# Patient Record
Sex: Male | Born: 1950
Health system: Southern US, Community
[De-identification: ages and names within clinical notes are randomized; demographics above are authoritative.]

## PROBLEM LIST (undated history)

## (undated) DIAGNOSIS — M1990 Unspecified osteoarthritis, unspecified site: Secondary | ICD-10-CM

## (undated) HISTORY — PX: KNEE ARTHROSCOPY W/ DEBRIDEMENT: SHX1867

## (undated) HISTORY — PX: ELBOW BURSA SURGERY: SHX615

## (undated) HISTORY — PX: CHOLECYSTECTOMY: SHX55

## (undated) HISTORY — PX: HYDROCELE EXCISION: SHX482

## (undated) HISTORY — PX: COLONOSCOPY: SHX174

## (undated) HISTORY — PX: COLON SURGERY: SHX602

---

## 2004-01-02 ENCOUNTER — Emergency Department (HOSPITAL_COMMUNITY): Admission: EM | Admit: 2004-01-02 | Discharge: 2004-01-02 | Payer: Self-pay | Admitting: Emergency Medicine

## 2004-01-12 ENCOUNTER — Observation Stay (HOSPITAL_COMMUNITY): Admission: AD | Admit: 2004-01-12 | Discharge: 2004-01-13 | Payer: Self-pay | Admitting: Internal Medicine

## 2006-01-20 ENCOUNTER — Ambulatory Visit: Payer: Self-pay | Admitting: Orthopedic Surgery

## 2006-01-24 ENCOUNTER — Ambulatory Visit: Payer: Self-pay | Admitting: Orthopedic Surgery

## 2006-01-24 ENCOUNTER — Ambulatory Visit (HOSPITAL_COMMUNITY): Admission: RE | Admit: 2006-01-24 | Discharge: 2006-01-24 | Payer: Self-pay | Admitting: Orthopedic Surgery

## 2006-01-27 ENCOUNTER — Ambulatory Visit: Payer: Self-pay | Admitting: Orthopedic Surgery

## 2006-01-30 ENCOUNTER — Ambulatory Visit (HOSPITAL_COMMUNITY): Admission: RE | Admit: 2006-01-30 | Discharge: 2006-01-30 | Payer: Self-pay | Admitting: Orthopedic Surgery

## 2006-01-30 ENCOUNTER — Ambulatory Visit: Payer: Self-pay | Admitting: Orthopedic Surgery

## 2006-01-31 ENCOUNTER — Ambulatory Visit: Payer: Self-pay | Admitting: Cardiology

## 2006-02-03 ENCOUNTER — Ambulatory Visit: Payer: Self-pay | Admitting: Cardiology

## 2006-02-06 ENCOUNTER — Ambulatory Visit: Payer: Self-pay | Admitting: Orthopedic Surgery

## 2006-02-06 ENCOUNTER — Ambulatory Visit: Payer: Self-pay | Admitting: Cardiology

## 2006-02-14 ENCOUNTER — Ambulatory Visit: Payer: Self-pay | Admitting: Cardiology

## 2006-02-19 ENCOUNTER — Ambulatory Visit: Payer: Self-pay | Admitting: Cardiology

## 2006-02-26 ENCOUNTER — Ambulatory Visit: Payer: Self-pay | Admitting: Cardiology

## 2006-02-27 ENCOUNTER — Ambulatory Visit: Payer: Self-pay | Admitting: Orthopedic Surgery

## 2006-03-10 ENCOUNTER — Ambulatory Visit: Payer: Self-pay | Admitting: Cardiology

## 2006-03-27 ENCOUNTER — Ambulatory Visit: Payer: Self-pay | Admitting: Orthopedic Surgery

## 2006-04-14 ENCOUNTER — Ambulatory Visit: Payer: Self-pay | Admitting: Cardiology

## 2006-04-24 ENCOUNTER — Ambulatory Visit: Payer: Self-pay | Admitting: Orthopedic Surgery

## 2006-04-29 ENCOUNTER — Ambulatory Visit: Payer: Self-pay | Admitting: Cardiology

## 2006-05-15 ENCOUNTER — Ambulatory Visit: Payer: Self-pay | Admitting: Cardiology

## 2006-06-17 ENCOUNTER — Ambulatory Visit: Payer: Self-pay | Admitting: Cardiology

## 2006-06-30 ENCOUNTER — Ambulatory Visit: Payer: Self-pay | Admitting: Orthopedic Surgery

## 2006-07-23 ENCOUNTER — Ambulatory Visit: Payer: Self-pay | Admitting: Cardiology

## 2006-09-29 ENCOUNTER — Ambulatory Visit: Payer: Self-pay | Admitting: Orthopedic Surgery

## 2006-09-30 ENCOUNTER — Encounter: Payer: Self-pay | Admitting: Orthopedic Surgery

## 2007-09-23 ENCOUNTER — Ambulatory Visit: Payer: Self-pay | Admitting: Orthopedic Surgery

## 2007-09-23 DIAGNOSIS — M25469 Effusion, unspecified knee: Secondary | ICD-10-CM | POA: Insufficient documentation

## 2007-09-23 DIAGNOSIS — M25569 Pain in unspecified knee: Secondary | ICD-10-CM | POA: Insufficient documentation

## 2007-10-07 ENCOUNTER — Ambulatory Visit: Payer: Self-pay | Admitting: Orthopedic Surgery

## 2009-12-12 ENCOUNTER — Ambulatory Visit (HOSPITAL_COMMUNITY): Admission: RE | Admit: 2009-12-12 | Discharge: 2009-12-12 | Payer: Self-pay | Admitting: General Surgery

## 2010-09-28 NOTE — H&P (Signed)
NAME:  Harold Payne, Harold Payne NO.:  [id number]   MEDICAL RECORD NO.:  [id number]          PATIENT TYPE:  AMB   LOCATION:                                FACILITY:  [facility]   PHYSICIAN:  Vickki Hearing, M.D.DATE OF BIRTH:  November 05, 1950   DATE OF ADMISSION:  DATE OF DISCHARGE:  LH                                HISTORY & PHYSICAL   CHIEF COMPLAINT:  Left knee pain.   HISTORY:  This is a 60 year old male with complains of medial and lateral  knee pain with swelling, status post injury in mid July 7 stepped off some  steps wrong, had a twisting injury, had immediate swelling and pain.  He has  trouble climbing the stairs now and turning and twisting.  He has taken some  oral anti-inflammatories which helped until they ran out.  He has also been  in a hinged knee brace.  He has had an MRI which shows osteoarthritis in his  knee mainly in the lateral femoral condyle.  There was a small Baker's cyst.  There was torn menisci medial and lateral.   We have discussed the treatment options including nonoperative treatment,  arthroscopy, arthroscopy with reconstruction and he has opted with my  recommendation for arthroscopic treatment to treat the menisci, debride the  ACL and treat the ACL tear with bracing and exercise.   REVIEW OF SYSTEMS:  He denied weight loss, chest pain, shortness of breath,  vomiting, kidney failure, headache, dizziness, thyroid disease, depression,  eczema, seasonal allergies or lymph node disease, complained of poor vision.  Wears glasses.   ALLERGIES:  NONE INCLUDING NO ALLERGY TO PENICILLIN.   No medical problems.  He has had surgery for colon cancer, left elbow.  I  did a right knee arthroscopy on him several years ago for arthritis.  He had  tonsillectomy and cholecystectomy.   MEDICATIONS:  Aciphex and lipitor.   He has a family history of heart disease, hypertension.  Mother has had a  pacemaker placed his father had kidney stones.   SOCIAL  HISTORY:  Married.  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  He is referred to [place] by  Dr.  Orvan Falconer, his medical physician.  He denies smoking.  He reports small amount  of alcohol use, coffee and sodas for caffeine use and he has his education  through grade 12.   PHYSICAL EXAMINATION:  His weight is 214.  His pulse was 74 and regular,  respiratory rate is 18 and regular.   His appearance was normal development, nutrition, grooming, hygiene, body  habitus was mesomorphic.   HEAD, EYES, EARS, NOSE AND THROAT:  Benign, neck was supple.  CHEST: Clear.  HEART:  Rate and rhythm normal.  ABDOMEN:  Was soft.  Pulses were good.  No lymphadenopathy.  Gait and station revealed a mild limp favoring his left lower extremity.   He had medial and lateral joint pain.  He did have full range of motion.  There was a small to medium-sized effusion.  His  ACL felt to have poor  endpoint, had a positive 2+ Lachman, there was no collateral ligament  instability.  Muscle strength and tone was normal.  Skin was intact.  He had  good reflexes, sensation.  He is oriented x3.  His mood and affect were  normal.  His MRI was done at [facility].  Report is included and it  showed he had an ACL tear at the femoral attachment.  A large tear of the  posterior horn of medial meniscus, small tear of anterior horn lateral  meniscus, small Baker's cyst.  There was no bone contusion or fracture in  the tibia or fibula.  There is chondromalacia and the lateral patella facet  grade 2.   IMPRESSION:  ACL tear and medial lateral meniscal tear.   PLAN:  Arthroscopy left knee for lateral and medial meniscectomies, exam of  knee under anesthesia to assess ACL instability, debride as needed but  conservative debridement of the ACL.  The patient will have therapy on the  left knee after surgery and wear a brace if needed for instability.      Vickki Hearing, M.D.  Electronically Signed     SEH/MEDQ  D:  01/20/2006  T:  01/20/2006  Job:  [id number]   cc:   Marcelino Duster  Fax: [phone]

## 2010-09-28 NOTE — Cardiovascular Report (Signed)
NAME:  Harold Payne, Harold Payne                          ACCOUNT NO.:  [id number]   MEDICAL RECORD NO.:  [id number]                   PATIENT TYPE:  INP   LOCATION:  3710                                 FACILITY:  [facility]   PHYSICIAN:  Arturo Morton. Riley Kill, M.D. [facility]         DATE OF BIRTH:  08-13-1950   DATE OF PROCEDURE:  01/12/2004  DATE OF DISCHARGE:  01/13/2004                              CARDIAC CATHETERIZATION   INDICATIONS:  The patient presents with ongoing chest pain.  He went to the  [facility] Emergency Room.  D-dimer was normal.  He was given morphine  sulfate, heparin and nitroglycerin and transferred to [facility].  The pain  was not pleuritic or positional and there were no definite EKG changes.  The  patient takes Aciphex and Lipitor.  He drinks occasional alcohol, but does  not smoke.  recommended.   PROCEDURES:  1.  Left heart catheterization.  2.  Selective coronary arteriography.  3.  Selective left ventriculography.  4.  Aortic root aortography.   DESCRIPTION OF PROCEDURE:  The patient was brought to the catheterization  lab and prepped and draped in the usual fashion.  Through an anterior  puncture, the right femoral artery was easily entered.  Central aortic and  left ventricular pressures were measured with a pigtail.  Ventriculography  was performed in the RAO projection.  Overall systolic function was  preserved.  Following pressure pullback, aortic root aortography was  performed without complication.  There was no evidence of significant aortic  regurgitation or dissection.  Following this, coronary arteriography was  performed in standard manner.  There were no complications.  The patient was  taken to the holding area in satisfactory clinical condition.   HEMODYNAMIC DATA:  1.  Central aorta:  107/75.  2.  Left ventricle:  104/11.  3.  No gradient on pullback across the aortic valve.   ANGIOGRAPHIC DATA:  1.  Ventriculography was performed in the RAO projection.   Overall systolic      function was well preserved and no segmental abnormalities or      contraction were identified.  2.  Aortic root aortography revealed no evidence of aortic regurgitation or      dissection.  3.  The right coronary artery was free of critical disease.  It is a large      caliber vessel providing a posterior descending and posterior lateral      system.  No significant focal obstruction was noted.  4.  Left main was free of critical disease.  5.  Left anterior descending artery coursed to the apex.  There is a major      diagonal branch.  The LAD and diagonal appear to be smooth without      significant narrowing.  6.  Ramus intermedius:  The superior branch of the ramus intermedius has      some mild irregularity in the mid portion of this  vessel.  On the      original shots we questioned whether there might be some delayed filling      of this vessel, but in subsequent shots the vessel looked smooth in      other views.  The inferior branch also is widely patent.  7.  The circumflex provides a marginal which has minimal luminal      irregularity.   CONCLUSION:  1.  Preserved left ventricular function.  2.  No evidence of aortic dissection.  3.  Minor coronary irregularities without significant focal areas of      obstruction.  4.  Negative D-dimer at [facility].   Aortic dissection has been ruled out as has significant high grade critical  obstruction.  Likewise, the D-dimer is normal.  Continued look for different  sources would be recommended.  The patient will be kept in the unit  overnight.  Continuous enzymes will be obtained.                                               Arturo Morton. Riley Kill, M.D. [facility]    TDS/MEDQ  D:  01/12/2004  T:  01/14/2004  Job:  [id number]   cc:   Marcelino Duster  [address] [place]  [place] [address]  Fax: [phone]

## 2010-09-28 NOTE — H&P (Signed)
NAME:  BEKIM, WERNTZ NO.:  [id number]   MEDICAL RECORD NO.:  [id number]                   PATIENT TYPE:  INP   LOCATION:  3710                                 FACILITY:  [facility]   PHYSICIAN:  Pricilla Riffle, M.D.                 DATE OF BIRTH:  05/12/1951   DATE OF ADMISSION:  01/12/2004  DATE OF DISCHARGE:                                HISTORY & PHYSICAL   IDENTIFICATION:  Harold Payne is a 60 year old gentleman with no known coronary  artery disease who presents for evaluation of chest pain.   The patient woke up this morning with chest tightness, some shortness of  breath, not like his reflux, it would not go away.  He went to his primary  MD and told him of the problem and he was sent on the Emergency Room at  [facility].  At [facility] an EKG was done that showed no acute ST changes.  He was given IV heparin, nitro and morphine with some easing of the pain  down to a 1/10 in intensity.  He was transferred to [facility] in a  [facility] transfer, he noted the pain increased to a 4/10, it is  nonpleuritic, not positional, and the patient denies injury.   ALLERGIES:  NONE.   MEDICATIONS:  1.  Aciphex.  2.  Lipitor.   PAST MEDICAL HISTORY:  1.  Dyslipidemia.  2.  GE reflux.   SOCIAL HISTORY:  The patient drinks occasionally.  No tobacco.   FAMILY HISTORY:  Mother with hypertension, father with diabetes, maternal  grandmother with CAD in 5's, maternal uncle with CAD in 53's.   PHYSICAL EXAMINATION:  This patient complains of 3/10 chest pain, but does  not appear in any acute distress.  Blood pressure 126 systolic, pulse is 60,  temperature is 98.  JVP is normal, no bruits.  LUNGS:  Clear.  CARDIAC:  Regular rate and rhythm, S1, S2, no S3, S4 or murmurs noted.  CHEST:  Nontender to palpation.  ABDOMEN:  Supple, no masses, no bruits.  EXTREMITIES:  Good distal pulses.  No lower extremity edema.  No bruits.   LABS:  Significant for a BUN  13, creatinine 1, potassium 3.8, CPK 100,  troponin less than 0.01, D-Dimer normal at 209, INR 1, hemoglobin 16.7, WBC  4.7.   Chest x-ray reported normal.   EKG shows normal sinus rhythm, no acute ST changes.   IMPRESSION:  The patient is a 60 year old with no known coronary artery  disease, presents with chest pain, this is different from his reflux,  concerning for unstable angina.  Would recommend left heart catheterization  to define anatomy.  The risks and benefits were explained.  The patient  understands and agrees to proceed.  Pricilla Riffle, M.D.    PVR/MEDQ  D:  01/12/2004  T:  01/12/2004  Job:  [id number]

## 2010-09-28 NOTE — Letter (Signed)
January 31, 2006     Vickki Hearing, M.D.  [address]  [place], [place] [address]   RE:  DEAVEN, URWIN  MRN:  [id number]  /  DOB:  [birth year]   Dear Weyman Croon:   Mr. Tubby was evaluated in the office at your request for DVT.  As you know,  this nice gentleman recently underwent arthroscopic surgery of his left  knee.  Three days postoperatively he noticed increased swelling and  discomfort in the left calf.  An ultrasound study revealed the presence of a  popliteal cyst, which has been present for some time, as well as DVT of the  veins below the knee.  Treatment with low molecular weight heparin has been  instituted, as well as warfarin.  The swelling has only increased.  The  patient continues to experience significant discomfort.   PAST MEDICAL HISTORY:  Notable for multiple other surgical procedures.  He  required drainage of a bursitis of his olecranon bursa.  Prior right knee  arthroscopy.  Cholecystectomy.  Colon resection for diverticular disease and  a polyp.  He has no hypertension or diabetes.  He underwent cardiac  catheterization 2 years ago, which was entirely normal.   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   FAMILY HISTORY:  Positive for hypertension.  His mother has required  implantation of a cardiac pacemaker.   REVIEW OF SYSTEMS:  Notable for a history of GERD and peptic ulcer disease,  and intermittent arthralgias.   EXAM:  Pleasant gentleman in no acute distress.  Weight is 213, blood pressure 125/75, heart rate 70 and regular.  NECK:  No jugular venous distension; normal carotid upstrokes without  bruits.  LUNGS:  Clear.  CARDIAC:  Normal first and second heart sounds; prominent 4th heart sounds,  normal PMI.  ABDOMEN:  Soft and nontender; no organomegaly; normal bowel sounds.  EXTREMITIES:  Moderate swelling of the left calf; healing  incisions over the  left knee; distal pulses intact.   EKG, normal sinus rhythm; within normal limits.   IMPRESSION:  Mr. Rudy has developed postoperative thrombophlebitis.  Although unusual after a minor procedure such as this one, I believe it is  warranted to consider this a non-spontaneous event.  Accordingly, a limited  duration of anticoagulation can be utilized.  I would anticipate 6 months,  but will see this nice gentleman again in 3 months to consider exactly when  warfarin can be discontinued.  He is advised to keep his leg elevated as  much as possible.  We will give him compression stockings for the left leg  only.  He is advised not to sit for prolonged periods of time with his legs  dependent.  We will monitor warfarin therapy in anticoagulation clinic.  Thanks so much for sending this nice gentleman to [place].    Sincerely,      Gerrit Friends. Dietrich Pates, MD, [facility]   RMR/MedQ  DD:  01/31/2006  DT:  02/03/2006  Job #:  [id number]

## 2010-09-28 NOTE — Discharge Summary (Signed)
NAME:  Harold Payne, Harold Payne NO.:  [id number]   MEDICAL RECORD NO.:  [id number]                   PATIENT TYPE:  INP   LOCATION:  3710                                 FACILITY:  [facility]   PHYSICIAN:  Pricilla Riffle, M.D.                 DATE OF BIRTH:  21-Jul-1950   DATE OF ADMISSION:  01/12/2004  DATE OF DISCHARGE:  01/13/2004                           DISCHARGE SUMMARY - REFERRING   PROCEDURE:  Cardiac catheterization September 1.   REASON FOR ADMISSION:  Patient is a 60 year old male with no prior history  of coronary artery disease transferred from [facility] Emergency Room  for evaluation of chest pain.  Please refer to dictated admission note for  full details.   LABORATORY DATA:  Normal cardiac markers.   HOSPITAL COURSE:  Following initial presentation to [facility]  Emergency Room where patient was stabilized on intravenous nitroglycerin and  heparin and where initial serial cardiac markers as well as D-dimer were  normal, patient was transferred and arrangements were made for same day  coronary angiography.   Procedure was performed by Dr. Riley Kill (see report for full details)  revealing normal epicardial coronary arteries and only minor irregularities  of some circumflex branches.  The aortic arch was normal as was the left  ventricle.   Dr. Riley Kill recommended medical therapy and consideration for GI evaluation.   Patient was kept for overnight observation and cleared for discharged the  following morning in hemodynamically stable condition.  Follow-up serial  cardiac markers were all normal.  No complications of right groin were  noted.   DISCHARGE MEDICATIONS:  1.  Coated aspirin 81 mg daily.  2.  Aciphex as previously directed.  3.  Lipitor 5 mg daily.   DISCHARGE INSTRUCTIONS:  No heavy lifting/driving x2 days.   DIET:  Maintain low fat, low cholesterol diet.   FOLLOWUP:  Call the office if there is any  swelling/bleeding of the groin.   Patient is scheduled to follow up with Arnette Felts, P.A.-C. on Friday,  September 9 at 11:15 a.m. at the [facility] in [place].  He was also  instructed to follow up with Dr. Marcelino Duster.   DISCHARGE DIAGNOSES:  1.  Noncardiac chest pain.      1.  Normal coronary angiogram.      2.  Normal left ventricular function.  2.  Dyslipidemia.  3.  Gastroesophageal reflux disease.      Gene Serpe, P.A. [facility]                      Pricilla Riffle, M.D.    GS/MEDQ  D:  01/13/2004  T:  01/14/2004  Job:  [id number]   cc:   Marcelino Duster  [address] [place]  [place] [address]  Fax: [phone]

## 2010-09-28 NOTE — Assessment & Plan Note (Signed)
[facility]                       [place] OFFICE NOTE   Harold Payne, Harold Payne                       MRN:          [id number]  DATE:07/23/2006                            DOB:          March 17, 1951    REFERRED BY:  None.   Harold Payne returns to the office for continued assessment and treatment  of DVT. It has now been six months since he developed this problem  following arthroscopic knee surgery. He was recently seen by Dr.  [name], who discontinued his warfarin. The patient some continued  issues with knee function, but he has noted no leg swelling and no calf  pain. He has stopped wearing his compression stockings. He reports a  fairly high sodium chloride intake.   His current medications include only atorvastatin 5 mg daily and Aciphex  20 mg daily.   On examination, pleasant, healthy-appearing gentleman. The weight is  225, two pounds more than in December. Blood pressure 120/80. Heart rate  is 65 and regular. Respirations is 16.  EXTREMITIES: No calf tenderness; 1/2+ pre-tibial edema.   IMPRESSION:  Harold Payne does have some minimal evidence of venous  insufficiency, but this is bilateral. Since his episode of DVT occurred  in the setting of a precipitating event, a six month course is  reasonable. The issues regarding salt intake were explained to him and  the options of leg elevation, reduction of sodium chloride in his diet  and compression stockings were described. He will return if he  experiences recurrent symptoms in the leg that could represent DVT.  Otherwise, I will be available to assist with his care as needed.     Gerrit Friends. Dietrich Pates, MD, [facility]  Electronically Signed    RMR/MedQ  DD: 07/23/2006  DT: 07/24/2006  Job #: [id number]   cc:   Vickki Hearing, M.D.

## 2010-09-28 NOTE — Op Note (Signed)
NAME:  Harold Payne, Harold Payne NO.:  [id number]   MEDICAL RECORD NO.:  [id number]          PATIENT TYPE:  AMB   LOCATION:  DAY                           FACILITY:  [facility]   PHYSICIAN:  Vickki Hearing, M.D.DATE OF BIRTH:  07-Feb-1951   DATE OF PROCEDURE:  01/24/2006  DATE OF DISCHARGE:                                 OPERATIVE REPORT   MEDICAL HISTORY:  Harold Payne is 60 years old and complained of medial and  lateral knee pain with swelling.  He injured himself July 7 stepping off  some steps, twisted his knee, had immediate swelling and pain.  He had  trouble climbing stairs and turning and twisting and pivoting on the left  knee. He initially took some oral anti-inflammatories and wore a hinged knee  brace and did not improve.  He subsequently had an MRI which showed  osteoarthritis, mainly of the lateral femoral condyle.  There was a torn  medial meniscus and a torn lateral meniscus, and it appeared that he had a  torn ACL.  Therefore, he was taken to surgery.   PREOPERATIVE DIAGNOSIS:  Torn anterior cruciate ligament, medial and lateral  meniscus, with osteoarthritis of the left knee.   POSTOPERATIVE DIAGNOSIS:  Torn medial meniscus, left knee, osteoarthritis  functional nonsurgical tear anterior cruciate ligament.   PROCEDURE:  Exam under anesthesia, arthroscopy, left knee, meniscectomy  medial meniscus, and abrasion arthroplasty lateral femoral condyle.   OPERATIVE FINDINGS:  The most notable finding was a torn medial meniscus,  complex tear posterior horn, quite extensive.  There was a grade 3 lesion of  the medial femoral condyle. There was a grade 4 lesion of the lateral  femoral condyle.  The ACL had scarred down to the PCL and was functionally  intact with no significant instability under anesthesia.   SURGEON:  Vickki Hearing, M.D.   ASSISTANT:  None.   ANESTHESIA:  General.   SPECIMENS:  None.   ESTIMATED BLOOD LOSS:  Minimal.   COMPLICATIONS:  None.   COUNTS:  Correct.   DISPOSITION:  The patient went to PACU in good condition.   PROCEDURE:  Harold Payne was identified in the preop holding area, his left  knee was marked for surgery.  This was then countersigned by me, the  surgeon.  Ancef was started.  His history and physical was updated and he  was taken to the operating room for general anesthetic.  After successful  anesthesia, he had his left knee prepped and draped and time-out procedure  was completed. We confirmed the procedure as arthroscopy of the left knee on  Harold Payne.   After a brief exam under anesthesia revealed no significant instability, a  lateral portal was established. A diagnostic arthroscopy was performed and  then a medial portal was established.  Using a duckbill motorized shaver and  a 50 degrees ArthroCare wand, a meniscectomy was performed until a stable  rim remained. The lesion on the medial femoral condyle was diffuse and the  cartilage was deficient, but there was no large gap defect or crater. This  was left alone.   We then turned our attention to the ACL which had been scarred down to the  PCL and the distal fibers was intact.   The lateral compartment presented a dilemma.  The patient had a large  fissure with delamination of a large portion of his lateral femoral  cartilage.  This was debrided to a stable rim which left a large grade 4  lesion.  We then performed an abrasion arthroplasty using a motorized bur  until we got of bleeding bone bed.  The knee was irrigated, suctioned dry.  The remaining fragments of meniscal tissue and articular cartilage tissue,  which was floating, was removed and the knee was injected with the remaining  10 mL of Marcaine with epinephrine.  Steri-Strips were used to close the  wound.   The patient will be discharged home in a brace.  He is to ambulate only with  the brace and crutches.  He will have a Cryocuff, Lorcet Plus.  He  can  change his dressing on Saturday, apply two Band-Aids.  He will return to me  on Monday to discuss further treatment.   I would like to get him set up with a CPM machine, keep him weight bearing  protected for an additional six weeks and start active and passive range of  motion with strengthening exercises in his left knee.  We dispensed 90  Lorcet Plus with three refills which should last about two months.      Vickki Hearing, M.D.  Electronically Signed     SEH/MEDQ  D:  01/24/2006  T:  01/24/2006  Job:  [id number]

## 2010-09-28 NOTE — Letter (Signed)
April 29, 2006    Vickki Hearing, M.D.  [address]  [place], [place] [address]   RE:  Harold Payne, Harold Payne  MRN:  [id number]  /  DOB:  16-Dec-1950   Dear Weyman Croon:   Mr. Harold Payne returns to the office for continued assessment and treatment  of DVT that occurred immediately following left knee arthroscopic  surgery.  He wore his compression stockings until 2 weeks ago.  He has  noted mild edema, but both the leg and knee have progressively improved  since his surgery.  He has done well on anticoagulation with stable INR  and no apparent adverse effects of warfarin.   Current medications include:  1. Atorvastatin 5 mg daily.  2. AcipHex 20 mg daily.  3. Warfarin as directed.   EXAMINATION:  GENERAL:  Pleasant well-appearing gentleman in no acute  distress.  VITAL SIGNS:  The weight is 223, 10 pounds more than 3 months ago.  Blood pressure 120/75, heart rate 70 and regular, respirations 16.  LUNGS:  Clear.  CARDIAC:  Normal first and second heart sounds.  EXTREMITIES:  No edema on the left; no calf tenderness.   IMPRESSION:  Mr. Harold Payne is doing generally well.  We will complete a 6-  month course of warfarin.  Stool for hemoccult testing and a CBC will be  checked due to his chronic use of anticoagulants.  I will see this nice  gentleman again in March.    Sincerely,      Gerrit Friends. Dietrich Pates, MD, [facility]  Electronically Signed    RMR/MedQ  DD: 04/29/2006  DT: 04/29/2006  Job #: [id number]

## 2012-04-24 DIAGNOSIS — R079 Chest pain, unspecified: Secondary | ICD-10-CM

## 2013-05-28 NOTE — H&P (Signed)
  NTS SOAP Note  Vital Signs:  Vitals as of: 1/85/6314: Systolic 970: Diastolic 82: Heart Rate 65: Temp 99.76F: Height 32ft 1in: Weight 221Lbs 0 Ounces: BMI 29.16  BMI : 29.16 kg/m2  Subjective: This 63 Years 1 Months old Male presents for followup colonoscopy.  Last had a colonoscopy over three years ago.  Had colon resection and was found to have a cancerous polyp.  Denies any gi complaints except for heartburn.  Review of Symptoms:  Constitutional:unremarkable   Head:unremarkable    Eyes:unremarkable   Nose/Mouth/Throat:unremarkable Cardiovascular:  unremarkable   Respiratory:unremarkable   Gastrointestinal:  unremarkable   Genitourinary:unremarkable     Musculoskeletal:unremarkable   Skin:unremarkable Hematolgic/Lymphatic:unremarkable     Allergic/Immunologic:unremarkable     Past Medical History:    Reviewed   Past Medical History  Surgical History: colon resection, knee surgeries, cholecystectomy Medical Problems: high cholesterol, reflux Allergies: nkda Medications: aciphex, lipitor, centrum   Social History:Reviewed  Social History  Preferred Language: English Race:  White Ethnicity: Not Hispanic / Latino Age: 63 Years 6 Months Marital Status:  M Alcohol: occassionally Recreational drug(s): no   Smoking Status: Never smoker reviewed on 05/27/2013 Functional Status reviewed on 05/27/2013 ------------------------------------------------ Bathing: Normal Cooking: Normal Dressing: Normal Driving: Normal Eating: Normal Managing Meds: Normal Oral Care: Normal Shopping: Normal Toileting: Normal Transferring: Normal Walking: Normal Cognitive Status reviewed on 05/27/2013 ------------------------------------------------ Attention: Normal Decision Making: Normal Language: Normal Memory: Normal Motor: Normal Perception: Normal Problem Solving: Normal Visual and Spatial: Normal   Family History:   [place] History Mother, Living; Heart block (conduction disorder);  Father, Living; Diabetes mellitus, unspecified type;     Objective Information: General:  Well appearing, well nourished in no distress. Heart:  RRR, no murmur Lungs:    CTA bilaterally, no wheezes, rhonchi, rales.  Breathing unlabored. Abdomen:Soft, NT/ND, no HSM, no masses.  Has small reducible incisional hernia at level of umbilicus.   rectal deferred to procedure  Assessment:h/o colon cancer    Plan:  Scheduled for TCS on 06/29/13.   Patient Education:Alternative treatments to surgery were discussed with patient (and family).  Risks and benefits  of procedure including bleeding and perforation were fully explained to the patient (and family) who gave informed consent. Patient/family questions were addressed.  Follow-up:Pending Surgery

## 2013-06-17 ENCOUNTER — Encounter (HOSPITAL_COMMUNITY): Payer: Self-pay | Admitting: Pharmacy Technician

## 2013-07-20 ENCOUNTER — Encounter (HOSPITAL_COMMUNITY)
Admission: RE | Disposition: A | Discharge status: Home / Self Care | Payer: Self-pay | Source: Ambulatory Visit | Attending: General Surgery

## 2013-07-20 ENCOUNTER — Encounter (HOSPITAL_COMMUNITY): Payer: Self-pay | Admitting: *Deleted

## 2013-07-20 ENCOUNTER — Ambulatory Visit (HOSPITAL_COMMUNITY)
Admission: RE | Admit: 2013-07-20 | Discharge: 2013-07-20 | Disposition: A | Discharge status: Home / Self Care | Payer: BC Managed Care – PPO | Source: Ambulatory Visit | Attending: General Surgery | Admitting: General Surgery

## 2013-07-20 DIAGNOSIS — Z79899 Other long term (current) drug therapy: Secondary | ICD-10-CM | POA: Insufficient documentation

## 2013-07-20 DIAGNOSIS — Z85038 Personal history of other malignant neoplasm of large intestine: Secondary | ICD-10-CM | POA: Insufficient documentation

## 2013-07-20 DIAGNOSIS — Z9049 Acquired absence of other specified parts of digestive tract: Secondary | ICD-10-CM | POA: Insufficient documentation

## 2013-07-20 DIAGNOSIS — Z09 Encounter for follow-up examination after completed treatment for conditions other than malignant neoplasm: Secondary | ICD-10-CM | POA: Insufficient documentation

## 2013-07-20 DIAGNOSIS — Z980 Intestinal bypass and anastomosis status: Secondary | ICD-10-CM | POA: Insufficient documentation

## 2013-07-20 DIAGNOSIS — E7800 Pure hypercholesterolemia, unspecified: Secondary | ICD-10-CM | POA: Insufficient documentation

## 2013-07-20 HISTORY — PX: COLONOSCOPY: SHX5424

## 2013-07-20 SURGERY — COLONOSCOPY
Anesthesia: Moderate Sedation

## 2013-07-20 MED ORDER — STERILE WATER FOR IRRIGATION IR SOLN
Status: DC | PRN
  Administered 2013-07-20: 08:00:00

## 2013-07-20 MED ORDER — MEPERIDINE HCL 50 MG/ML IJ SOLN
INTRAMUSCULAR | Status: AC
  Filled 2013-07-20: qty 1

## 2013-07-20 MED ORDER — MEPERIDINE HCL 50 MG/ML IJ SOLN
INTRAMUSCULAR | Status: DC | PRN
  Administered 2013-07-20: 50 mg via INTRAVENOUS

## 2013-07-20 MED ORDER — SODIUM CHLORIDE 0.9 % IV SOLN
INTRAVENOUS | Status: DC
Start: 2013-07-20 — End: 2013-07-20
  Administered 2013-07-20: 08:00:00 via INTRAVENOUS

## 2013-07-20 MED ORDER — MIDAZOLAM HCL 5 MG/5ML IJ SOLN
INTRAMUSCULAR | Status: DC | PRN
  Administered 2013-07-20: 3 mg via INTRAVENOUS

## 2013-07-20 MED ORDER — MIDAZOLAM HCL 5 MG/5ML IJ SOLN
INTRAMUSCULAR | Status: AC
  Filled 2013-07-20: qty 10

## 2013-07-20 NOTE — Discharge Instructions (Signed)
Colonoscopy, Care After °Refer to this sheet in the next few weeks. These instructions provide you with information on caring for yourself after your procedure. Your health care provider may also give you more specific instructions. Your treatment has been planned according to current medical practices, but problems sometimes occur. Call your health care provider if you have any problems or questions after your procedure. °WHAT TO EXPECT AFTER THE PROCEDURE  °After your procedure, it is typical to have the following: °· A small amount of blood in your stool. °· Moderate amounts of gas and mild abdominal cramping or bloating. °HOME CARE INSTRUCTIONS °· Do not drive, operate machinery, or sign important documents for 24 hours. °· You may shower and resume your regular physical activities, but move at a slower pace for the first 24 hours. °· Take frequent rest periods for the first 24 hours. °· Walk around or put a warm pack on your abdomen to help reduce abdominal cramping and bloating. °· Drink enough fluids to keep your urine clear or pale yellow. °· You may resume your normal diet as instructed by your health care provider. Avoid heavy or fried foods that are hard to digest. °· Avoid drinking alcohol for 24 hours or as instructed by your health care provider. °· Only take over-the-counter or prescription medicines as directed by your health care provider. °· If a tissue sample (biopsy) was taken during your procedure: °· Do not take aspirin or blood thinners for 7 days, or as instructed by your health care provider. °· Do not drink alcohol for 7 days, or as instructed by your health care provider. °· Eat soft foods for the first 24 hours. °SEEK MEDICAL CARE IF: °You have persistent spotting of blood in your stool 2 3 days after the procedure. °SEEK IMMEDIATE MEDICAL CARE IF: °· You have more than a small spotting of blood in your stool. °· You pass large blood clots in your stool. °· Your abdomen is swollen  (distended). °· You have nausea or vomiting. °· You have a fever. °· You have increasing abdominal pain that is not relieved with medicine. °Document Released: 12/12/2003 Document Revised: 02/17/2013 Document Reviewed: 01/04/2013 °ExitCare® Patient Information ©2014 [employer]. ° °

## 2013-07-20 NOTE — Op Note (Signed)
[facility] [address] [place], [address]   COLONOSCOPY PROCEDURE REPORT  PATIENT: Harold, Payne  MR#: [id number] BIRTHDATE: 1950-11-23 , 62  yrs. old GENDER: Male ENDOSCOPIST: Aviva Signs, MD REFERRED GH:WEXH, Amy PROCEDURE DATE:  07/20/2013 PROCEDURE:   Colonoscopy, surveillance ASA CLASS:   Class II INDICATIONS:colonic adenocarcinoma. MEDICATIONS: Versed 3 mg IV and Demerol 50 mg IV  DESCRIPTION OF PROCEDURE:   After the risks benefits and alternatives of the procedure were thoroughly explained, informed consent was obtained.  A digital rectal exam revealed no abnormalities of the rectum.   The EC-[id number] ([id number])  endoscope was introduced through the anus and advanced to the cecum, which was identified by both the appendix and ileocecal valve. No adverse events experienced.   The quality of the prep was adequate, using MoviPrep  The instrument was then slowly withdrawn as the colon was fully examined.      COLON FINDINGS: A normal appearing cecum, ileocecal valve, and appendiceal orifice were identified.  The ascending, hepatic flexure, transverse, splenic flexure, descending, sigmoid colon and rectum appeared unremarkable.  No polyps or cancers were seen. Anastomosis at colorectal juncture widely patent.  Retroflexed views revealed no abnormalities. The time to cecum=2 minutes 0 seconds.  Withdrawal time=5 minutes 0 seconds.  The scope was withdrawn and the procedure completed. COMPLICATIONS: There were no complications.  ENDOSCOPIC IMPRESSION: 1.   Normal colon 2.   Anastomosis at colorectal juncture widely patent.  RECOMMENDATIONS: Repeat Colonoscopy in 5 years.   eSigned:  Aviva Signs, MD 07/20/2013 8:26 AM   cc:

## 2013-07-20 NOTE — Interval H&P Note (Signed)
History and Physical Interval Note:  07/20/2013 7:58 AM  Harold Payne  has presented today for surgery, with the diagnosis of h/o colon cancer  The various methods of treatment have been discussed with the patient and family. After consideration of risks, benefits and other options for treatment, the patient has consented to  Procedure(s): COLONOSCOPY (N/A) as a surgical intervention .  The patient's history has been reviewed, patient examined, no change in status, stable for surgery.  I have reviewed the patient's chart and labs.  Questions were answered to the patient's satisfaction.     Aviva Signs A

## 2013-07-22 ENCOUNTER — Encounter (HOSPITAL_COMMUNITY): Payer: Self-pay | Admitting: General Surgery

## 2014-02-01 ENCOUNTER — Encounter (INDEPENDENT_AMBULATORY_CARE_PROVIDER_SITE_OTHER): Payer: Self-pay

## 2014-02-01 ENCOUNTER — Ambulatory Visit (INDEPENDENT_AMBULATORY_CARE_PROVIDER_SITE_OTHER): Payer: BC Managed Care – PPO | Admitting: Orthopedic Surgery

## 2014-02-01 ENCOUNTER — Ambulatory Visit (INDEPENDENT_AMBULATORY_CARE_PROVIDER_SITE_OTHER): Payer: BC Managed Care – PPO

## 2014-02-01 VITALS — BP 141/83 | Ht 73.0 in | Wt 218.0 lb

## 2014-02-01 DIAGNOSIS — M25569 Pain in unspecified knee: Secondary | ICD-10-CM

## 2014-02-01 DIAGNOSIS — M25562 Pain in left knee: Secondary | ICD-10-CM

## 2014-02-01 DIAGNOSIS — T148XXA Other injury of unspecified body region, initial encounter: Secondary | ICD-10-CM

## 2014-02-01 MED ORDER — HYDROCODONE-ACETAMINOPHEN 7.5-325 MG PO TABS: 1.0000 | ORAL_TABLET | Freq: Four times a day (QID) | ORAL | Status: DC | PRN

## 2014-02-01 NOTE — Progress Notes (Signed)
New patient visit  Chief Complaint  Patient presents with  . Knee Pain    Left knee pain and swelling x 1 week   Knee Pain: Patient complains of left knee pain. This is evaluated as a personal injury. The pain began 1 week ago. The pain is located medial tibial plateau which started as a direct trauma to the left knee with no pre-injury symptoms despite previous knee surgery back in 2007 at which time have a torn medial meniscus and osteoarthritis and a functional nonsurgical tear the anterior cruciate ligament with exam under anesthesia showing no instability. Of note there was a grade 3 lesion medial femoral condyle and grade 4 lesion of the lateral femoral condyle  After the tree stand leg hit his knee about a week later Advil has not relieved his symptoms his pain is worse with walking  Review of systems negative he does have some back pain but everything else was normal  Medical history of blood clots and arthritis  . Right knee surgery left knee surgery colon resection cholecystectomy and surgery on the left elbow  Current medications are AcipHex Lipitor Centrum and Advil  He did not report any allergies  He has a family history diabetes COPD reflux hypertension  He does not smoke he has an occasional drink every week does not have any Street drug use  BP 141/83  Ht 6\' 1"  (1.854 m)  Wt 218 lb (98.884 kg)  BMI 28.77 kg/m2 PS well-developed and well-nourished grooming and hygiene are normal he has no deformities he has normal orientation to person place and time his mood and affect are normal and pleasant his gait and station are unremarkable and there is no limp  Has tenderness over the proximal tibia on the medial side with minimal joint line tenderness no effusion full range of motion and a stable knee the muscle tone is normal scans intact he has good distal pulses without lymphadenopathy. Sensation is normal in the left leg is no pathologic reflexes has excellent  balance  His x-rays show severe medial arthritis of the knee  Impression bone contusion  He is going on a trip like something done to try to get him through history I injected his knee for pain over the medial tibia this was not a joint injection  Procedure note  Injection  Verbal consent was obtained to inject the  left knee/tibia  Timeout procedure was completed to confirm injection site  Diagnosis bone contusion tibia  Medications used Depo-Medrol 40 mg 1 cc Lidocaine 1% plain 3 cc  Anesthesia was provided by ethyl chloride spray  Prep was performed with alcohol  Technique of injection  at the point of maximal tenderness the medication was injected no complications   No complications were noted

## 2016-09-21 DIAGNOSIS — M62830 Muscle spasm of back: Secondary | ICD-10-CM | POA: Diagnosis not present

## 2016-09-21 DIAGNOSIS — Z6830 Body mass index (BMI) 30.0-30.9, adult: Secondary | ICD-10-CM | POA: Diagnosis not present

## 2016-10-28 DIAGNOSIS — R739 Hyperglycemia, unspecified: Secondary | ICD-10-CM | POA: Diagnosis not present

## 2016-10-28 DIAGNOSIS — K210 Gastro-esophageal reflux disease with esophagitis: Secondary | ICD-10-CM | POA: Diagnosis not present

## 2016-10-28 DIAGNOSIS — E782 Mixed hyperlipidemia: Secondary | ICD-10-CM | POA: Diagnosis not present

## 2016-10-28 DIAGNOSIS — E7800 Pure hypercholesterolemia, unspecified: Secondary | ICD-10-CM | POA: Diagnosis not present

## 2016-10-28 DIAGNOSIS — G459 Transient cerebral ischemic attack, unspecified: Secondary | ICD-10-CM | POA: Diagnosis not present

## 2016-11-01 DIAGNOSIS — Z6830 Body mass index (BMI) 30.0-30.9, adult: Secondary | ICD-10-CM | POA: Diagnosis not present

## 2016-11-01 DIAGNOSIS — E782 Mixed hyperlipidemia: Secondary | ICD-10-CM | POA: Diagnosis not present

## 2016-11-01 DIAGNOSIS — L03311 Cellulitis of abdominal wall: Secondary | ICD-10-CM | POA: Diagnosis not present

## 2016-11-01 DIAGNOSIS — R7301 Impaired fasting glucose: Secondary | ICD-10-CM | POA: Diagnosis not present

## 2016-11-01 DIAGNOSIS — Z1389 Encounter for screening for other disorder: Secondary | ICD-10-CM | POA: Diagnosis not present

## 2016-11-01 DIAGNOSIS — K219 Gastro-esophageal reflux disease without esophagitis: Secondary | ICD-10-CM | POA: Diagnosis not present

## 2016-11-18 DIAGNOSIS — R509 Fever, unspecified: Secondary | ICD-10-CM | POA: Diagnosis not present

## 2016-11-18 DIAGNOSIS — J209 Acute bronchitis, unspecified: Secondary | ICD-10-CM | POA: Diagnosis not present

## 2016-11-18 DIAGNOSIS — Z6830 Body mass index (BMI) 30.0-30.9, adult: Secondary | ICD-10-CM | POA: Diagnosis not present

## 2016-11-21 DIAGNOSIS — J209 Acute bronchitis, unspecified: Secondary | ICD-10-CM | POA: Diagnosis not present

## 2016-11-21 DIAGNOSIS — Z6830 Body mass index (BMI) 30.0-30.9, adult: Secondary | ICD-10-CM | POA: Diagnosis not present

## 2016-11-21 DIAGNOSIS — J0101 Acute recurrent maxillary sinusitis: Secondary | ICD-10-CM | POA: Diagnosis not present

## 2016-12-03 ENCOUNTER — Encounter: Payer: Self-pay | Admitting: General Surgery

## 2016-12-03 ENCOUNTER — Ambulatory Visit (INDEPENDENT_AMBULATORY_CARE_PROVIDER_SITE_OTHER): Payer: Medicare Other | Admitting: General Surgery

## 2016-12-03 VITALS — BP 145/89 | HR 62 | Temp 98.7°F | Resp 18 | Ht 73.0 in | Wt 225.0 lb

## 2016-12-03 DIAGNOSIS — K432 Incisional hernia without obstruction or gangrene: Secondary | ICD-10-CM | POA: Diagnosis not present

## 2016-12-03 NOTE — Progress Notes (Signed)
Harold Payne, Harold Payne; [id number]; [birth year]   HPI   Patient is a 66 year old white male who was referred to my care by Dr. Rory Percy for evaluation and treatment of a ventral hernia.  Patient states he has been present for several years, but recently noticed increase in size of the hernia.  It is not uncomfortable at this time.  He is status post a partial colectomy in the past and another facility.  He denies any nausea or vomiting.  He has 0/10 pain. No past medical history on file.  Past Surgical History:  Procedure Laterality Date  . COLON SURGERY    . COLONOSCOPY    . COLONOSCOPY N/A 07/20/2013   Procedure: COLONOSCOPY;  Surgeon: Jamesetta So, MD;  Location: [facility];  Service: Gastroenterology;  Laterality: N/A;  . ELBOW BURSA SURGERY Left   . HYDROCELE EXCISION    . KNEE ARTHROSCOPY W/ DEBRIDEMENT      No family history on file.  Current Outpatient Prescriptions on File Prior to Visit  Medication Sig Dispense Refill  . atorvastatin (LIPITOR) 10 MG tablet Take 5 mg by mouth daily.    Marland Kitchen HYDROcodone-acetaminophen (NORCO) 7.5-325 MG per tablet Take 1 tablet by mouth every 6 (six) hours as needed for moderate pain. 60 tablet 0  . ibuprofen (ADVIL,MOTRIN) 200 MG tablet Take 800 mg by mouth 2 (two) times daily as needed for moderate pain.    . Multiple Vitamins-Minerals (CENTRUM PO) Take 1 tablet by mouth daily.    . RABEprazole (ACIPHEX) 20 MG tablet Take 20 mg by mouth daily.     No current facility-administered medications on file prior to visit.     No Known Allergies  History  Alcohol Use  . Yes    Comment: once a week    History  Smoking Status  . Never Smoker  Smokeless Tobacco  . Never Used    Review of Systems  Constitutional: Negative.   HENT: Negative.   Eyes: Negative.   Respiratory: Negative.   Cardiovascular: Negative.   Gastrointestinal: Negative.   Genitourinary: Negative.   Musculoskeletal: Negative.   Skin: Negative.   Neurological:  Negative.   Endo/Heme/Allergies: Negative.   Psychiatric/Behavioral: Negative.     Objective   Vitals:   12/03/16 1131  BP: (!) 145/89  Pulse: 62  Resp: 18  Temp: 98.7 F (37.1 C)    Physical Exam  Constitutional: He is oriented to person, place, and time and well-developed, well-nourished, and in no distress.  HENT:  Head: Normocephalic and atraumatic.  Cardiovascular: Normal rate, regular rhythm and normal heart sounds.   Pulmonary/Chest: Effort normal and breath sounds normal. He has no wheezes.  Abdominal: Soft. Bowel sounds are normal. He exhibits no distension. There is no tenderness.  Reducible incisional hernia just to the right of the midline incision below the umbilicus.  Nontender.  Neurological: He is alert and oriented to person, place, and time.  Skin: Skin is warm and dry.  Vitals reviewed.    Dr. Lyman Speller notes reviewed. Assessment    Incisional hernia, currently asymptomatic Plan    no need for acute surgical intervention at this time as patient is asymptomatic and the risk of incarceration is low.  Patient understands this and agrees.  He was given a signs and symptoms of incarceration.  Literature was given.  Follow-up as needed.

## 2016-12-03 NOTE — Patient Instructions (Signed)
 Ventral Hernia A ventral hernia is a bulge of tissue from inside the abdomen that pushes through a weak area of the muscles that form the front wall of the abdomen. The tissues inside the abdomen are inside a sac (peritoneum). These tissues include the small intestine, large intestine, and the fatty tissue that covers the intestines (omentum). Sometimes, the bulge that forms a hernia contains intestines. Other hernias contain only fat. Ventral hernias do not go away without surgical treatment. There are several types of ventral hernias. You may have:  A hernia at an incision site from previous abdominal surgery (incisional hernia).  A hernia just above the belly button (epigastric hernia), or at the belly button (umbilical hernia). These types of hernias can develop from heavy lifting or straining.  A hernia that comes and goes (reducible hernia). It may be visible only when you lift or strain. This type of hernia can be pushed back into the abdomen (reduced).  A hernia that traps abdominal tissue inside the hernia (incarcerated hernia). This type of hernia does not reduce.  A hernia that cuts off blood flow to the tissues inside the hernia (strangulated hernia). The tissues can start to die if this happens. This is a very painful bulge that cannot be reduced. A strangulated hernia is a medical emergency.  What are the causes? This condition is caused by abdominal tissue putting pressure on an area of weakness in the abdominal muscles. What increases the risk? The following factors may make you more likely to develop this condition:  Being male.  Being 60 or older.  Being overweight or obese.  Having had previous abdominal surgery, especially if there was an infection after surgery.  Having had an injury to the abdominal wall.  Having had several pregnancies.  Having a buildup of fluid inside the abdomen (ascites).  What are the signs or symptoms? The only symptom of a ventral  hernia may be a painless bulge in the abdomen. A reducible hernia may be visible only when you strain, cough, or lift. Other symptoms may include:  Dull pain.  A feeling of pressure.  Signs and symptoms of a strangulated hernia may include:  Increasing pain.  Nausea and vomiting.  Pain when pressing on the hernia.  The skin over the hernia turning red or purple.  Constipation.  Blood in the stool (feces).  How is this diagnosed? This condition may be diagnosed based on:  Your symptoms.  Your medical history.  A physical exam. You may be asked to cough or strain while standing. These actions increase the pressure inside your abdomen and force the hernia through the opening in your muscles. Your health care provider may try to reduce the hernia by pressing on it.  Imaging studies, such as an ultrasound or CT scan.  How is this treated? This condition is treated with surgery. If you have a strangulated hernia, surgery is done as soon as possible. If your hernia is small and not incarcerated, you may be asked to lose some weight before surgery. Follow these instructions at home:  Follow instructions from your health care provider about eating or drinking restrictions.  If you are overweight, your health care provider may recommend that you increase your activity level and eat a healthier diet.  Do not lift anything that is heavier than 10 lb (4.5 kg).  Return to your normal activities as told by your health care provider. Ask your health care provider what activities are safe for you. You   may need to avoid activities that increase pressure on your hernia.  Take over-the-counter and prescription medicines only as told by your health care provider.  Keep all follow-up visits as told by your health care provider. This is important. Contact a health care provider if:  Your hernia gets larger.  Your hernia becomes painful. Get help right away if:  Your hernia becomes  increasingly painful.  You have pain along with any of the following: ? Changes in skin color in the area of the hernia. ? Nausea. ? Vomiting. ? Fever. Summary  A ventral hernia is a bulge of tissue from inside the abdomen that pushes through a weak area of the muscles that form the front wall of the abdomen.  This condition is treated with surgery, which may be urgent depending on your hernia.  Do not lift anything that is heavier than 10 lb (4.5 kg), and follow activity instructions from your health care provider. This information is not intended to replace advice given to you by your health care provider. Make sure you discuss any questions you have with your health care provider. Document Released: 04/15/2012 Document Revised: 12/15/2015 Document Reviewed: 12/15/2015 Elsevier Interactive Patient Education  2018 [employer]  

## 2017-02-11 DIAGNOSIS — Z23 Encounter for immunization: Secondary | ICD-10-CM | POA: Diagnosis not present

## 2017-02-11 DIAGNOSIS — Z6830 Body mass index (BMI) 30.0-30.9, adult: Secondary | ICD-10-CM | POA: Diagnosis not present

## 2017-02-11 DIAGNOSIS — M25562 Pain in left knee: Secondary | ICD-10-CM | POA: Diagnosis not present

## 2017-02-11 DIAGNOSIS — M1990 Unspecified osteoarthritis, unspecified site: Secondary | ICD-10-CM | POA: Diagnosis not present

## 2017-03-11 DIAGNOSIS — M25562 Pain in left knee: Secondary | ICD-10-CM | POA: Diagnosis not present

## 2017-03-11 DIAGNOSIS — M170 Bilateral primary osteoarthritis of knee: Secondary | ICD-10-CM | POA: Diagnosis not present

## 2017-03-11 DIAGNOSIS — M25561 Pain in right knee: Secondary | ICD-10-CM | POA: Diagnosis not present

## 2017-03-30 DIAGNOSIS — M1990 Unspecified osteoarthritis, unspecified site: Secondary | ICD-10-CM | POA: Diagnosis not present

## 2017-03-30 DIAGNOSIS — T7840XA Allergy, unspecified, initial encounter: Secondary | ICD-10-CM | POA: Diagnosis not present

## 2017-03-30 DIAGNOSIS — Z87891 Personal history of nicotine dependence: Secondary | ICD-10-CM | POA: Diagnosis not present

## 2017-03-30 DIAGNOSIS — L500 Allergic urticaria: Secondary | ICD-10-CM | POA: Diagnosis not present

## 2017-04-11 DIAGNOSIS — M170 Bilateral primary osteoarthritis of knee: Secondary | ICD-10-CM | POA: Diagnosis not present

## 2017-04-30 DIAGNOSIS — E782 Mixed hyperlipidemia: Secondary | ICD-10-CM | POA: Diagnosis not present

## 2017-04-30 DIAGNOSIS — R7301 Impaired fasting glucose: Secondary | ICD-10-CM | POA: Diagnosis not present

## 2017-04-30 DIAGNOSIS — H43393 Other vitreous opacities, bilateral: Secondary | ICD-10-CM | POA: Diagnosis not present

## 2017-04-30 DIAGNOSIS — H524 Presbyopia: Secondary | ICD-10-CM | POA: Diagnosis not present

## 2017-04-30 DIAGNOSIS — R739 Hyperglycemia, unspecified: Secondary | ICD-10-CM | POA: Diagnosis not present

## 2017-04-30 DIAGNOSIS — E7800 Pure hypercholesterolemia, unspecified: Secondary | ICD-10-CM | POA: Diagnosis not present

## 2017-04-30 DIAGNOSIS — K210 Gastro-esophageal reflux disease with esophagitis: Secondary | ICD-10-CM | POA: Diagnosis not present

## 2017-05-02 DIAGNOSIS — E782 Mixed hyperlipidemia: Secondary | ICD-10-CM | POA: Diagnosis not present

## 2017-05-02 DIAGNOSIS — L03311 Cellulitis of abdominal wall: Secondary | ICD-10-CM | POA: Diagnosis not present

## 2017-05-02 DIAGNOSIS — R7301 Impaired fasting glucose: Secondary | ICD-10-CM | POA: Diagnosis not present

## 2017-05-02 DIAGNOSIS — Z6829 Body mass index (BMI) 29.0-29.9, adult: Secondary | ICD-10-CM | POA: Diagnosis not present

## 2017-05-02 DIAGNOSIS — K219 Gastro-esophageal reflux disease without esophagitis: Secondary | ICD-10-CM | POA: Diagnosis not present

## 2017-06-17 DIAGNOSIS — D481 Neoplasm of uncertain behavior of connective and other soft tissue: Secondary | ICD-10-CM | POA: Diagnosis not present

## 2017-06-17 DIAGNOSIS — M1712 Unilateral primary osteoarthritis, left knee: Secondary | ICD-10-CM | POA: Diagnosis not present

## 2017-07-25 DIAGNOSIS — Z86718 Personal history of other venous thrombosis and embolism: Secondary | ICD-10-CM | POA: Diagnosis not present

## 2017-07-25 DIAGNOSIS — Z79899 Other long term (current) drug therapy: Secondary | ICD-10-CM | POA: Diagnosis not present

## 2017-07-25 DIAGNOSIS — E7800 Pure hypercholesterolemia, unspecified: Secondary | ICD-10-CM | POA: Diagnosis not present

## 2017-07-25 DIAGNOSIS — S6992XA Unspecified injury of left wrist, hand and finger(s), initial encounter: Secondary | ICD-10-CM | POA: Diagnosis not present

## 2017-07-25 DIAGNOSIS — K219 Gastro-esophageal reflux disease without esophagitis: Secondary | ICD-10-CM | POA: Diagnosis not present

## 2017-07-25 DIAGNOSIS — S61211A Laceration without foreign body of left index finger without damage to nail, initial encounter: Secondary | ICD-10-CM | POA: Diagnosis not present

## 2017-07-25 DIAGNOSIS — W230XXA Caught, crushed, jammed, or pinched between moving objects, initial encounter: Secondary | ICD-10-CM | POA: Diagnosis not present

## 2017-07-25 DIAGNOSIS — Z7982 Long term (current) use of aspirin: Secondary | ICD-10-CM | POA: Diagnosis not present

## 2017-07-25 DIAGNOSIS — Z87891 Personal history of nicotine dependence: Secondary | ICD-10-CM | POA: Diagnosis not present

## 2017-08-01 DIAGNOSIS — S61412D Laceration without foreign body of left hand, subsequent encounter: Secondary | ICD-10-CM | POA: Diagnosis not present

## 2017-08-01 DIAGNOSIS — Z6830 Body mass index (BMI) 30.0-30.9, adult: Secondary | ICD-10-CM | POA: Diagnosis not present

## 2017-08-19 DIAGNOSIS — Z6830 Body mass index (BMI) 30.0-30.9, adult: Secondary | ICD-10-CM | POA: Diagnosis not present

## 2017-08-19 DIAGNOSIS — S46811A Strain of other muscles, fascia and tendons at shoulder and upper arm level, right arm, initial encounter: Secondary | ICD-10-CM | POA: Diagnosis not present

## 2017-08-21 DIAGNOSIS — M25561 Pain in right knee: Secondary | ICD-10-CM | POA: Diagnosis not present

## 2017-08-21 DIAGNOSIS — M170 Bilateral primary osteoarthritis of knee: Secondary | ICD-10-CM | POA: Diagnosis not present

## 2017-08-21 DIAGNOSIS — M25562 Pain in left knee: Secondary | ICD-10-CM | POA: Diagnosis not present

## 2017-10-27 DIAGNOSIS — E782 Mixed hyperlipidemia: Secondary | ICD-10-CM | POA: Diagnosis not present

## 2017-10-27 DIAGNOSIS — E7800 Pure hypercholesterolemia, unspecified: Secondary | ICD-10-CM | POA: Diagnosis not present

## 2017-10-27 DIAGNOSIS — K210 Gastro-esophageal reflux disease with esophagitis: Secondary | ICD-10-CM | POA: Diagnosis not present

## 2017-10-27 DIAGNOSIS — R739 Hyperglycemia, unspecified: Secondary | ICD-10-CM | POA: Diagnosis not present

## 2017-10-27 DIAGNOSIS — M1990 Unspecified osteoarthritis, unspecified site: Secondary | ICD-10-CM | POA: Diagnosis not present

## 2017-11-04 DIAGNOSIS — K219 Gastro-esophageal reflux disease without esophagitis: Secondary | ICD-10-CM | POA: Diagnosis not present

## 2017-11-04 DIAGNOSIS — Z6830 Body mass index (BMI) 30.0-30.9, adult: Secondary | ICD-10-CM | POA: Diagnosis not present

## 2017-11-04 DIAGNOSIS — E782 Mixed hyperlipidemia: Secondary | ICD-10-CM | POA: Diagnosis not present

## 2017-11-04 DIAGNOSIS — Z0000 Encounter for general adult medical examination without abnormal findings: Secondary | ICD-10-CM | POA: Diagnosis not present

## 2017-11-04 DIAGNOSIS — R7301 Impaired fasting glucose: Secondary | ICD-10-CM | POA: Diagnosis not present

## 2017-11-04 DIAGNOSIS — L03311 Cellulitis of abdominal wall: Secondary | ICD-10-CM | POA: Diagnosis not present

## 2017-12-17 DIAGNOSIS — M170 Bilateral primary osteoarthritis of knee: Secondary | ICD-10-CM | POA: Diagnosis not present

## 2018-02-05 DIAGNOSIS — Z23 Encounter for immunization: Secondary | ICD-10-CM | POA: Diagnosis not present

## 2018-04-21 DIAGNOSIS — M1712 Unilateral primary osteoarthritis, left knee: Secondary | ICD-10-CM | POA: Diagnosis not present

## 2018-04-21 DIAGNOSIS — M25562 Pain in left knee: Secondary | ICD-10-CM | POA: Diagnosis not present

## 2018-04-27 DIAGNOSIS — R739 Hyperglycemia, unspecified: Secondary | ICD-10-CM | POA: Diagnosis not present

## 2018-04-27 DIAGNOSIS — E782 Mixed hyperlipidemia: Secondary | ICD-10-CM | POA: Diagnosis not present

## 2018-04-27 DIAGNOSIS — K210 Gastro-esophageal reflux disease with esophagitis: Secondary | ICD-10-CM | POA: Diagnosis not present

## 2018-04-27 DIAGNOSIS — E7800 Pure hypercholesterolemia, unspecified: Secondary | ICD-10-CM | POA: Diagnosis not present

## 2018-05-01 DIAGNOSIS — R7301 Impaired fasting glucose: Secondary | ICD-10-CM | POA: Diagnosis not present

## 2018-05-01 DIAGNOSIS — Z6829 Body mass index (BMI) 29.0-29.9, adult: Secondary | ICD-10-CM | POA: Diagnosis not present

## 2018-05-01 DIAGNOSIS — E782 Mixed hyperlipidemia: Secondary | ICD-10-CM | POA: Diagnosis not present

## 2018-05-01 DIAGNOSIS — K219 Gastro-esophageal reflux disease without esophagitis: Secondary | ICD-10-CM | POA: Diagnosis not present

## 2018-05-18 DIAGNOSIS — E119 Type 2 diabetes mellitus without complications: Secondary | ICD-10-CM | POA: Diagnosis not present

## 2018-05-18 DIAGNOSIS — I10 Essential (primary) hypertension: Secondary | ICD-10-CM | POA: Diagnosis not present

## 2018-06-27 DIAGNOSIS — Z6829 Body mass index (BMI) 29.0-29.9, adult: Secondary | ICD-10-CM | POA: Diagnosis not present

## 2018-06-27 DIAGNOSIS — J111 Influenza due to unidentified influenza virus with other respiratory manifestations: Secondary | ICD-10-CM | POA: Diagnosis not present

## 2018-06-27 DIAGNOSIS — R05 Cough: Secondary | ICD-10-CM | POA: Diagnosis not present

## 2018-07-30 ENCOUNTER — Ambulatory Visit: Payer: Medicare Other | Admitting: General Surgery

## 2018-09-18 DIAGNOSIS — M5412 Radiculopathy, cervical region: Secondary | ICD-10-CM | POA: Diagnosis not present

## 2018-09-18 DIAGNOSIS — M1712 Unilateral primary osteoarthritis, left knee: Secondary | ICD-10-CM | POA: Diagnosis not present

## 2018-10-16 DIAGNOSIS — E782 Mixed hyperlipidemia: Secondary | ICD-10-CM | POA: Diagnosis not present

## 2018-10-16 DIAGNOSIS — I10 Essential (primary) hypertension: Secondary | ICD-10-CM | POA: Diagnosis not present

## 2018-10-16 DIAGNOSIS — R5383 Other fatigue: Secondary | ICD-10-CM | POA: Diagnosis not present

## 2018-10-20 DIAGNOSIS — E782 Mixed hyperlipidemia: Secondary | ICD-10-CM | POA: Diagnosis not present

## 2018-10-20 DIAGNOSIS — Z6828 Body mass index (BMI) 28.0-28.9, adult: Secondary | ICD-10-CM | POA: Diagnosis not present

## 2018-10-20 DIAGNOSIS — R7301 Impaired fasting glucose: Secondary | ICD-10-CM | POA: Diagnosis not present

## 2018-10-20 DIAGNOSIS — M4313 Spondylolisthesis, cervicothoracic region: Secondary | ICD-10-CM | POA: Diagnosis not present

## 2018-10-20 DIAGNOSIS — K219 Gastro-esophageal reflux disease without esophagitis: Secondary | ICD-10-CM | POA: Diagnosis not present

## 2018-10-20 DIAGNOSIS — M79601 Pain in right arm: Secondary | ICD-10-CM | POA: Diagnosis not present

## 2018-10-20 DIAGNOSIS — R202 Paresthesia of skin: Secondary | ICD-10-CM | POA: Diagnosis not present

## 2018-10-20 DIAGNOSIS — M79603 Pain in arm, unspecified: Secondary | ICD-10-CM | POA: Diagnosis not present

## 2018-10-20 DIAGNOSIS — M5083 Other cervical disc disorders, cervicothoracic region: Secondary | ICD-10-CM | POA: Diagnosis not present

## 2018-10-20 DIAGNOSIS — R200 Anesthesia of skin: Secondary | ICD-10-CM | POA: Diagnosis not present

## 2018-10-20 DIAGNOSIS — Z23 Encounter for immunization: Secondary | ICD-10-CM | POA: Diagnosis not present

## 2018-11-24 DIAGNOSIS — Z23 Encounter for immunization: Secondary | ICD-10-CM | POA: Diagnosis not present

## 2018-12-22 DIAGNOSIS — M47812 Spondylosis without myelopathy or radiculopathy, cervical region: Secondary | ICD-10-CM | POA: Diagnosis not present

## 2018-12-22 DIAGNOSIS — M19011 Primary osteoarthritis, right shoulder: Secondary | ICD-10-CM | POA: Diagnosis not present

## 2018-12-22 DIAGNOSIS — M1712 Unilateral primary osteoarthritis, left knee: Secondary | ICD-10-CM | POA: Diagnosis not present

## 2018-12-22 DIAGNOSIS — M792 Neuralgia and neuritis, unspecified: Secondary | ICD-10-CM | POA: Diagnosis not present

## 2018-12-24 DIAGNOSIS — Z77090 Contact with and (suspected) exposure to asbestos: Secondary | ICD-10-CM | POA: Diagnosis not present

## 2018-12-24 DIAGNOSIS — R5383 Other fatigue: Secondary | ICD-10-CM | POA: Diagnosis not present

## 2018-12-24 DIAGNOSIS — Z6829 Body mass index (BMI) 29.0-29.9, adult: Secondary | ICD-10-CM | POA: Diagnosis not present

## 2018-12-24 DIAGNOSIS — R042 Hemoptysis: Secondary | ICD-10-CM | POA: Diagnosis not present

## 2018-12-24 DIAGNOSIS — Z23 Encounter for immunization: Secondary | ICD-10-CM | POA: Diagnosis not present

## 2018-12-28 DIAGNOSIS — M47815 Spondylosis without myelopathy or radiculopathy, thoracolumbar region: Secondary | ICD-10-CM | POA: Diagnosis not present

## 2018-12-28 DIAGNOSIS — M19012 Primary osteoarthritis, left shoulder: Secondary | ICD-10-CM | POA: Diagnosis not present

## 2018-12-28 DIAGNOSIS — K7689 Other specified diseases of liver: Secondary | ICD-10-CM | POA: Diagnosis not present

## 2018-12-28 DIAGNOSIS — I700 Atherosclerosis of aorta: Secondary | ICD-10-CM | POA: Diagnosis not present

## 2018-12-28 DIAGNOSIS — J9811 Atelectasis: Secondary | ICD-10-CM | POA: Diagnosis not present

## 2018-12-28 DIAGNOSIS — R042 Hemoptysis: Secondary | ICD-10-CM | POA: Diagnosis not present

## 2018-12-28 DIAGNOSIS — M19011 Primary osteoarthritis, right shoulder: Secondary | ICD-10-CM | POA: Diagnosis not present

## 2019-02-09 ENCOUNTER — Other Ambulatory Visit: Payer: Self-pay

## 2019-02-09 ENCOUNTER — Encounter: Payer: Self-pay | Admitting: General Surgery

## 2019-02-09 ENCOUNTER — Ambulatory Visit (INDEPENDENT_AMBULATORY_CARE_PROVIDER_SITE_OTHER): Payer: Medicare Other | Admitting: General Surgery

## 2019-02-09 VITALS — BP 132/82 | HR 66 | Temp 98.0°F | Resp 16 | Ht 73.0 in | Wt 214.0 lb

## 2019-02-09 DIAGNOSIS — Z85038 Personal history of other malignant neoplasm of large intestine: Secondary | ICD-10-CM | POA: Diagnosis not present

## 2019-02-09 MED ORDER — GOLYTELY 236 G PO SOLR: 4000.0000 mL | Freq: Once | ORAL | 0 refills | Status: AC

## 2019-02-09 NOTE — H&P (Signed)
Lamarco Abney.; [id number]; 07-18-1950   HPI Patient is a 68 year old white male who was referred to my care by Dr. Nadara Mustard for a screening colonoscopy.  Patient is status post a partial colectomy for colon cancer in the past and now presents for his 5-year follow-up.  He denies any weight loss, blood per rectum, abnormal constipation or diarrhea.  He does have an incisional hernia which is starting to get larger.  He currently has 0 out of 10 abdominal pain. History reviewed. No pertinent past medical history.  Past Surgical History:  Procedure Laterality Date  . COLON SURGERY    . COLONOSCOPY    . COLONOSCOPY N/A 07/20/2013   Procedure: COLONOSCOPY;  Surgeon: Jamesetta So, MD;  Location: [facility];  Service: Gastroenterology;  Laterality: N/A;  . ELBOW BURSA SURGERY Left   . HYDROCELE EXCISION    . KNEE ARTHROSCOPY W/ DEBRIDEMENT      History reviewed. No pertinent family history.  Current Outpatient Medications on File Prior to Visit  Medication Sig Dispense Refill  . aspirin EC 81 MG tablet Take by mouth.    Marland Kitchen atorvastatin (LIPITOR) 10 MG tablet Take 5 mg by mouth daily.    . diclofenac (VOLTAREN) 75 MG EC tablet Take by mouth.    Marland Kitchen ibuprofen (ADVIL,MOTRIN) 200 MG tablet Take 800 mg by mouth 2 (two) times daily as needed for moderate pain.    . Multiple Vitamin (MULTI-VITAMIN) tablet Take by mouth.    . Multiple Vitamins-Minerals (CENTRUM PO) Take 1 tablet by mouth daily.    . RABEprazole (ACIPHEX) 20 MG tablet Take 20 mg by mouth daily.    Marland Kitchen HYDROcodone-acetaminophen (NORCO) 7.5-325 MG per tablet Take 1 tablet by mouth every 6 (six) hours as needed for moderate pain. (Patient not taking: Reported on 02/09/2019) 60 tablet 0   No current facility-administered medications on file prior to visit.     No Known Allergies  Social History   Substance and Sexual Activity  Alcohol Use Yes   Comment: once a week    Social History   Tobacco Use  Smoking Status Never Smoker   Smokeless Tobacco Never Used    Review of Systems  Constitutional: Negative.   HENT: Negative.   Eyes: Negative.   Respiratory: Negative.   Cardiovascular: Negative.   Gastrointestinal: Negative.   Genitourinary: Negative.   Musculoskeletal: Positive for joint pain.  Skin: Negative.   Neurological: Negative.   Endo/Heme/Allergies: Negative.   Psychiatric/Behavioral: Negative.     Objective   Vitals:   02/09/19 1511  BP: 132/82  Pulse: 66  Resp: 16  Temp: 98 F (36.7 C)  SpO2: 94%    Physical Exam Vitals signs reviewed.  Constitutional:      Appearance: Normal appearance. He is normal weight. He is not ill-appearing.  HENT:     Head: Normocephalic and atraumatic.  Cardiovascular:     Rate and Rhythm: Normal rate and regular rhythm.     Heart sounds: Normal heart sounds. No murmur. No friction rub. No gallop.   Pulmonary:     Effort: Pulmonary effort is normal. No respiratory distress.     Breath sounds: Normal breath sounds. No stridor. No wheezing, rhonchi or rales.  Abdominal:     General: Abdomen is flat. Bowel sounds are normal. There is no distension.     Palpations: Abdomen is soft. There is no mass.     Tenderness: There is no guarding or rebound.     Hernia:  A hernia is present.     Comments: Reducible incisional hernia present in midportion of abdomen at the umbilical level.  Does extend to the right.  Skin:    General: Skin is warm and dry.  Neurological:     Mental Status: He is alert and oriented to person, place, and time.   Previous colonoscopy report reviewed  Assessment  Personal history of colon cancer, need for surveillance colonoscopy Plan   Patient is scheduled for a colonoscopy on 02/16/2019.  The risks and benefits of the procedure including bleeding and perforation were fully explained to the patient, who gave informed consent.

## 2019-02-09 NOTE — Progress Notes (Signed)
Harold Payne.; [id number]; 1950/12/08   HPI Patient is a 68 year old white male who was referred to my care by Dr. Nadara Mustard for a screening colonoscopy.  Patient is status post a partial colectomy for colon cancer in the past and now presents for his 5-year follow-up.  He denies any weight loss, blood per rectum, abnormal constipation or diarrhea.  He does have an incisional hernia which is starting to get larger.  He currently has 0 out of 10 abdominal pain. History reviewed. No pertinent past medical history.  Past Surgical History:  Procedure Laterality Date  . COLON SURGERY    . COLONOSCOPY    . COLONOSCOPY N/A 07/20/2013   Procedure: COLONOSCOPY;  Surgeon: Jamesetta So, MD;  Location: [facility];  Service: Gastroenterology;  Laterality: N/A;  . ELBOW BURSA SURGERY Left   . HYDROCELE EXCISION    . KNEE ARTHROSCOPY W/ DEBRIDEMENT      History reviewed. No pertinent family history.  Current Outpatient Medications on File Prior to Visit  Medication Sig Dispense Refill  . aspirin EC 81 MG tablet Take by mouth.    Marland Kitchen atorvastatin (LIPITOR) 10 MG tablet Take 5 mg by mouth daily.    . diclofenac (VOLTAREN) 75 MG EC tablet Take by mouth.    Marland Kitchen ibuprofen (ADVIL,MOTRIN) 200 MG tablet Take 800 mg by mouth 2 (two) times daily as needed for moderate pain.    . Multiple Vitamin (MULTI-VITAMIN) tablet Take by mouth.    . Multiple Vitamins-Minerals (CENTRUM PO) Take 1 tablet by mouth daily.    . RABEprazole (ACIPHEX) 20 MG tablet Take 20 mg by mouth daily.    Marland Kitchen HYDROcodone-acetaminophen (NORCO) 7.5-325 MG per tablet Take 1 tablet by mouth every 6 (six) hours as needed for moderate pain. (Patient not taking: Reported on 02/09/2019) 60 tablet 0   No current facility-administered medications on file prior to visit.     No Known Allergies  Social History   Substance and Sexual Activity  Alcohol Use Yes   Comment: once a week    Social History   Tobacco Use  Smoking Status Never Smoker   Smokeless Tobacco Never Used    Review of Systems  Constitutional: Negative.   HENT: Negative.   Eyes: Negative.   Respiratory: Negative.   Cardiovascular: Negative.   Gastrointestinal: Negative.   Genitourinary: Negative.   Musculoskeletal: Positive for joint pain.  Skin: Negative.   Neurological: Negative.   Endo/Heme/Allergies: Negative.   Psychiatric/Behavioral: Negative.     Objective   Vitals:   02/09/19 1511  BP: 132/82  Pulse: 66  Resp: 16  Temp: 98 F (36.7 C)  SpO2: 94%    Physical Exam Vitals signs reviewed.  Constitutional:      Appearance: Normal appearance. He is normal weight. He is not ill-appearing.  HENT:     Head: Normocephalic and atraumatic.  Cardiovascular:     Rate and Rhythm: Normal rate and regular rhythm.     Heart sounds: Normal heart sounds. No murmur. No friction rub. No gallop.   Pulmonary:     Effort: Pulmonary effort is normal. No respiratory distress.     Breath sounds: Normal breath sounds. No stridor. No wheezing, rhonchi or rales.  Abdominal:     General: Abdomen is flat. Bowel sounds are normal. There is no distension.     Palpations: Abdomen is soft. There is no mass.     Tenderness: There is no guarding or rebound.     Hernia:  A hernia is present.     Comments: Reducible incisional hernia present in midportion of abdomen at the umbilical level.  Does extend to the right.  Skin:    General: Skin is warm and dry.  Neurological:     Mental Status: He is alert and oriented to person, place, and time.   Previous colonoscopy report reviewed  Assessment  Personal history of colon cancer, need for surveillance colonoscopy Plan   Patient is scheduled for a colonoscopy on 02/16/2019.  The risks and benefits of the procedure including bleeding and perforation were fully explained to the patient, who gave informed consent.

## 2019-02-09 NOTE — Patient Instructions (Signed)
Colonoscopy, Adult A colonoscopy is an exam to look at the entire large intestine. During the exam, a lubricated, flexible tube that has a camera on the end of it is inserted into the anus and then passed into the rectum, colon, and other parts of the large intestine. You may have a colonoscopy as a part of normal colorectal screening or if you have certain symptoms, such as:  Lack of red blood cells (anemia).  Diarrhea that does not go away.  Abdominal pain.  Blood in your stool (feces). A colonoscopy can help screen for and diagnose medical problems, including:  Tumors.  Polyps.  Inflammation.  Areas of bleeding. Tell a health care provider about:  Any allergies you have.  All medicines you are taking, including vitamins, herbs, eye drops, creams, and over-the-counter medicines.  Any problems you or family members have had with anesthetic medicines.  Any blood disorders you have.  Any surgeries you have had.  Any medical conditions you have.  Any problems you have had passing stool. What are the risks? Generally, this is a safe procedure. However, problems may occur, including:  Bleeding.  A tear in the intestine.  A reaction to medicines given during the exam.  Infection (rare). What happens before the procedure? Bowel prep If you were prescribed an oral bowel prep to clean out your colon:  Take it as told by your health care provider. Starting the day before your procedure, you will need to drink a large amount of medicated liquid. The liquid will cause you to have multiple loose stools until your stool is almost clear or light green.  If your skin or anus gets irritated from diarrhea, you may use these to relieve the irritation: ? Medicated wipes, such as adult wet wipes with aloe and vitamin E. ? A skin-soothing product like petroleum jelly.  If you vomit while drinking the bowel prep, take a break for up to 60 minutes and then begin the bowel prep again.  If vomiting continues and you cannot take the bowel prep without vomiting, call your health care provider.  To clean out your colon, you may also be given: ? Laxative medicines. ? Instructions about how to use an enema. General instructions  Ask your health care provider about: ? Changing or stopping your regular medicines or supplements. This is especially important if you are taking iron supplements, diabetes medicines, or blood thinners. ? Taking medicines such as aspirin and ibuprofen. These medicines can thin your blood. Do not take these medicines before the procedure if your health care provider tells you not to.  Plan to have someone take you home from the hospital or clinic. What happens during the procedure?   An IV may be inserted into one of your veins.  You will be given medicine to help you relax (sedative).  To reduce your risk of infection: ? Your health care team will wash or sanitize their hands. ? Your anal area will be washed with soap.  You will be asked to lie on your side with your knees bent.  Your health care provider will lubricate a long, thin, flexible tube. The tube will have a camera and a light on the end.  The tube will be inserted into your anus.  The tube will be gently eased through your rectum and colon.  Air will be delivered into your colon to keep it open. You may feel some pressure or cramping.  The camera will be used to take images during   the procedure.  A small tissue sample may be removed to be examined under a microscope (biopsy).  If small polyps are found, your health care provider may remove them and have them checked for cancer cells.  When the exam is done, the tube will be removed. The procedure may vary among health care providers and hospitals. What happens after the procedure?  Your blood pressure, heart rate, breathing rate, and blood oxygen level will be monitored until the medicines you were given have worn off.  Do  not drive for 24 hours after the exam.  You may have a small amount of blood in your stool.  You may pass gas and have mild abdominal cramping or bloating due to the air that was used to inflate your colon during the exam.  It is up to you to get the results of your procedure. Ask your health care provider, or the department performing the procedure, when your results will be ready. Summary  A colonoscopy is an exam to look at the entire large intestine.  During a colonoscopy, a lubricated, flexible tube with a camera on the end of it is inserted into the anus and then passed into the colon and other parts of the large intestine.  Follow instructions from your health care provider about eating and drinking before the procedure.  If you were prescribed an oral bowel prep to clean out your colon, take it as told by your health care provider.  After your procedure, your blood pressure, heart rate, breathing rate, and blood oxygen level will be monitored until the medicines you were given have worn off. This information is not intended to replace advice given to you by your health care provider. Make sure you discuss any questions you have with your health care provider. Document Released: 04/26/2000 Document Revised: 02/19/2017 Document Reviewed: 07/11/2015 Elsevier Patient Education  2020 Elsevier Inc.  

## 2019-02-12 ENCOUNTER — Other Ambulatory Visit: Payer: Self-pay

## 2019-02-12 ENCOUNTER — Other Ambulatory Visit (HOSPITAL_COMMUNITY)
Admission: RE | Admit: 2019-02-12 | Discharge: 2019-02-12 | Disposition: A | Discharge status: Home / Self Care | Payer: Medicare Other | Source: Ambulatory Visit | Attending: General Surgery | Admitting: General Surgery

## 2019-02-12 DIAGNOSIS — Z20828 Contact with and (suspected) exposure to other viral communicable diseases: Secondary | ICD-10-CM | POA: Diagnosis not present

## 2019-02-12 DIAGNOSIS — Z01812 Encounter for preprocedural laboratory examination: Secondary | ICD-10-CM | POA: Insufficient documentation

## 2019-02-12 DIAGNOSIS — Z85038 Personal history of other malignant neoplasm of large intestine: Secondary | ICD-10-CM | POA: Insufficient documentation

## 2019-02-12 LAB — SARS CORONAVIRUS 2 (TAT 6-24 HRS): SARS Coronavirus 2: NEGATIVE

## 2019-02-15 NOTE — Progress Notes (Signed)
Responded to a page indicating Mr. Stedman wanted to see a Chaplain.  He wanted to have his HCPOA notarized which was accomplished.  [employer] of care and support and prayers at bedside.  De Burrs Chaplain Resident Pager:  [phone]

## 2019-02-16 ENCOUNTER — Ambulatory Visit (HOSPITAL_COMMUNITY)
Admission: RE | Admit: 2019-02-16 | Discharge: 2019-02-16 | Disposition: A | Discharge status: Home / Self Care | Payer: Medicare Other | Attending: General Surgery | Admitting: General Surgery

## 2019-02-16 ENCOUNTER — Encounter (HOSPITAL_COMMUNITY)
Admission: RE | Disposition: A | Discharge status: Home / Self Care | Payer: Self-pay | Source: Home / Self Care | Attending: General Surgery

## 2019-02-16 ENCOUNTER — Encounter (HOSPITAL_COMMUNITY): Payer: Self-pay

## 2019-02-16 DIAGNOSIS — Z1211 Encounter for screening for malignant neoplasm of colon: Secondary | ICD-10-CM | POA: Diagnosis not present

## 2019-02-16 DIAGNOSIS — Z980 Intestinal bypass and anastomosis status: Secondary | ICD-10-CM | POA: Diagnosis not present

## 2019-02-16 DIAGNOSIS — Z85038 Personal history of other malignant neoplasm of large intestine: Secondary | ICD-10-CM | POA: Diagnosis not present

## 2019-02-16 DIAGNOSIS — Z79899 Other long term (current) drug therapy: Secondary | ICD-10-CM | POA: Insufficient documentation

## 2019-02-16 DIAGNOSIS — K432 Incisional hernia without obstruction or gangrene: Secondary | ICD-10-CM | POA: Insufficient documentation

## 2019-02-16 DIAGNOSIS — Z7982 Long term (current) use of aspirin: Secondary | ICD-10-CM | POA: Diagnosis not present

## 2019-02-16 HISTORY — DX: Unspecified osteoarthritis, unspecified site: M19.90

## 2019-02-16 HISTORY — PX: COLONOSCOPY: SHX5424

## 2019-02-16 SURGERY — COLONOSCOPY
Anesthesia: Moderate Sedation

## 2019-02-16 MED ORDER — MIDAZOLAM HCL 5 MG/5ML IJ SOLN
INTRAMUSCULAR | Status: DC | PRN
  Administered 2019-02-16: 3 mg via INTRAVENOUS

## 2019-02-16 MED ORDER — STERILE WATER FOR IRRIGATION IR SOLN
Status: DC | PRN
  Administered 2019-02-16: 2.5 mL

## 2019-02-16 MED ORDER — SODIUM CHLORIDE 0.9 % IV SOLN
INTRAVENOUS | Status: DC
  Administered 2019-02-16: 07:00:00 via INTRAVENOUS

## 2019-02-16 MED ORDER — MEPERIDINE HCL 50 MG/ML IJ SOLN
INTRAMUSCULAR | Status: DC | PRN
  Administered 2019-02-16: 50 mg via INTRAVENOUS

## 2019-02-16 MED ORDER — MEPERIDINE HCL 50 MG/ML IJ SOLN
INTRAMUSCULAR | Status: AC
  Filled 2019-02-16: qty 1

## 2019-02-16 MED ORDER — MIDAZOLAM HCL 5 MG/5ML IJ SOLN
INTRAMUSCULAR | Status: AC
  Filled 2019-02-16: qty 5

## 2019-02-16 NOTE — Op Note (Signed)
[facility] Patient Name: Harold Payne Procedure Date: 02/16/2019 7:06 AM MRN: [id number] Date of Birth: 1950-10-22 Attending MD: Aviva Signs , MD CSN: [id number] Age: 68 Admit Type: Outpatient Procedure:                Colonoscopy Indications:              High risk colon cancer surveillance: Personal                            history of colon cancer Providers:                Aviva Signs, MD, Otis Peak B. Gwenlyn Perking RN, RN, Aram Candela Referring MD:              Medicines:                Midazolam 3 mg IV, Meperidine 50 mg IV Complications:            No immediate complications. Estimated Blood Loss:     Estimated blood loss: none. Procedure:                Pre-Anesthesia Assessment:                           - Prior to the procedure, a History and Physical                            was performed, and patient medications and                            allergies were reviewed. The patient is competent.                            The risks and benefits of the procedure and the                            sedation options and risks were discussed with the                            patient. All questions were answered and informed                            consent was obtained. Patient identification and                            proposed procedure were verified by the physician,                            the nurse and the technician in the endoscopy                            suite. Mental Status Examination: alert and  oriented. Airway Examination: normal oropharyngeal                            airway and neck mobility. Respiratory Examination:                            clear to auscultation. CV Examination: RRR, no                            murmurs, no S3 or S4. Prophylactic Antibiotics: The                            patient does not require prophylactic antibiotics.                            Prior Anticoagulants: The  patient has taken no                            previous anticoagulant or antiplatelet agents                            except for aspirin. ASA Grade Assessment: II - A                            patient with mild systemic disease. After reviewing                            the risks and benefits, the patient was deemed in                            satisfactory condition to undergo the procedure.                            The anesthesia plan was to use moderate sedation /                            analgesia (conscious sedation). Immediately prior                            to administration of medications, the patient was                            re-assessed for adequacy to receive sedatives. The                            heart rate, respiratory rate, oxygen saturations,                            blood pressure, adequacy of pulmonary ventilation,                            and response to care were monitored throughout the  procedure. The physical status of the patient was                            re-assessed after the procedure.                           After obtaining informed consent, the colonoscope                            was passed under direct vision. Throughout the                            procedure, the patient's blood pressure, pulse, and                            oxygen saturations were monitored continuously. The                            [id number] scope was introduced through                            the anus and advanced to the the cecum, identified                            by the appendiceal orifice, ileocecal valve and                            palpation. No anatomical landmarks were                            photographed. The entire colon was well visualized.                            The colonoscopy was performed without difficulty.                            The quality of the bowel preparation was adequate.                             The total duration of the procedure was 10 minutes. Scope In: 7:25:17 AM Scope Out: 7:33:03 AM Scope Withdrawal Time: 0 hours 4 minutes 52 seconds  Total Procedure Duration: 0 hours 7 minutes 46 seconds  Findings:      The perianal and digital rectal examinations were normal.      The entire examined colon appeared normal on direct and retroflexion       views.      Anastomosis low in rectum widely patent. Impression:               - The entire examined colon is normal on direct and                            retroflexion views.                           -  No specimens collected. Moderate Sedation:      Moderate (conscious) sedation was administered by the endoscopy nurse       and supervised by the endoscopist. [Parameters Monitored]. [Sedation       Duration Time]. Recommendation:           - Written discharge instructions were provided to                            the patient.                           - The signs and symptoms of potential delayed                            complications were discussed with the patient.                           - Patient has a contact number available for                            emergencies.                           - Return to normal activities tomorrow.                           - Resume previous diet.                           - Continue present medications.                           - Repeat colonoscopy in 5 years for surveillance. Procedure Code(s):        --- Professional ---                           [id number], Colonoscopy, flexible; diagnostic, including                            collection of specimen(s) by brushing or washing,                            when performed (separate procedure) Diagnosis Code(s):        --- Professional ---                           [id number], Personal history of other malignant                            neoplasm of large intestine CPT copyright 2019 [employer]. All  rights reserved. The codes documented in this report are preliminary and upon coder review may  be revised to meet current compliance requirements. Aviva Signs, MD Aviva Signs, MD 02/16/2019 7:42:29 AM This report has been signed electronically. Number of Addenda: 0

## 2019-02-16 NOTE — Discharge Instructions (Signed)
Colonoscopy, Adult, Care After °This sheet gives you information about how to care for yourself after your procedure. Your health care provider may also give you more specific instructions. If you have problems or questions, contact your health care provider. °What can I expect after the procedure? °After the procedure, it is common to have: °· A small amount of blood in your stool for 24 hours after the procedure. °· Some gas. °· Mild abdominal cramping or bloating. °Follow these instructions at home: °General instructions °· For the first 24 hours after the procedure: °? Do not drive or use machinery. °? Do not sign important documents. °? Do not drink alcohol. °? Do your regular daily activities at a slower pace than normal. °? Eat soft, easy-to-digest foods. °· Take over-the-counter or prescription medicines only as told by your health care provider. °Relieving cramping and bloating ° °· Try walking around when you have cramps or feel bloated. °· Apply heat to your abdomen as told by your health care provider. Use a heat source that your health care provider recommends, such as a moist heat pack or a heating pad. °? Place a towel between your skin and the heat source. °? Leave the heat on for 20-30 minutes. °? Remove the heat if your skin turns bright red. This is especially important if you are unable to feel pain, heat, or cold. You may have a greater risk of getting burned. °Eating and drinking ° °· Drink enough fluid to keep your urine pale yellow. °· Resume your normal diet as instructed by your health care provider. Avoid heavy or fried foods that are hard to digest. °· Avoid drinking alcohol for as long as instructed by your health care provider. °Contact a health care provider if: °· You have blood in your stool 2-3 days after the procedure. °Get help right away if: °· You have more than a small spotting of blood in your stool. °· You pass large blood clots in your stool. °· Your abdomen is  swollen. °· You have nausea or vomiting. °· You have a fever. °· You have increasing abdominal pain that is not relieved with medicine. °Summary °· After the procedure, it is common to have a small amount of blood in your stool. You may also have mild abdominal cramping and bloating. °· For the first 24 hours after the procedure, do not drive or use machinery, sign important documents, or drink alcohol. °· Contact your health care provider if you have a lot of blood in your stool, nausea or vomiting, a fever, or increased abdominal pain. °This information is not intended to replace advice given to you by your health care provider. Make sure you discuss any questions you have with your health care provider. °Document Released: 12/12/2003 Document Revised: 02/19/2017 Document Reviewed: 07/11/2015 °Elsevier Patient Education © 2020 Elsevier Inc. ° °

## 2019-02-16 NOTE — Interval H&P Note (Signed)
History and Physical Interval Note:  02/16/2019 7:12 AM  Harold Payne.  has presented today for surgery, with the diagnosis of PERSONAL HISTORY OF COLON CANCER.  The various methods of treatment have been discussed with the patient and family. After consideration of risks, benefits and other options for treatment, the patient has consented to  Procedure(s) with comments: COLONOSCOPY (N/A) - 7:30am as a surgical intervention.  The patient's history has been reviewed, patient examined, no change in status, stable for surgery.  I have reviewed the patient's chart and labs.  Questions were answered to the patient's satisfaction.     Aviva Signs

## 2019-02-23 ENCOUNTER — Encounter (HOSPITAL_COMMUNITY): Payer: Self-pay | Admitting: General Surgery

## 2019-03-24 DIAGNOSIS — M19011 Primary osteoarthritis, right shoulder: Secondary | ICD-10-CM | POA: Diagnosis not present

## 2019-03-24 DIAGNOSIS — D481 Neoplasm of uncertain behavior of connective and other soft tissue: Secondary | ICD-10-CM | POA: Diagnosis not present

## 2019-03-24 DIAGNOSIS — M170 Bilateral primary osteoarthritis of knee: Secondary | ICD-10-CM | POA: Diagnosis not present

## 2019-04-07 DIAGNOSIS — I10 Essential (primary) hypertension: Secondary | ICD-10-CM | POA: Diagnosis not present

## 2019-04-07 DIAGNOSIS — R7301 Impaired fasting glucose: Secondary | ICD-10-CM | POA: Diagnosis not present

## 2019-04-07 DIAGNOSIS — K219 Gastro-esophageal reflux disease without esophagitis: Secondary | ICD-10-CM | POA: Diagnosis not present

## 2019-04-07 DIAGNOSIS — E782 Mixed hyperlipidemia: Secondary | ICD-10-CM | POA: Diagnosis not present

## 2019-04-07 DIAGNOSIS — E7800 Pure hypercholesterolemia, unspecified: Secondary | ICD-10-CM | POA: Diagnosis not present

## 2019-04-12 DIAGNOSIS — E782 Mixed hyperlipidemia: Secondary | ICD-10-CM | POA: Diagnosis not present

## 2019-04-12 DIAGNOSIS — I10 Essential (primary) hypertension: Secondary | ICD-10-CM | POA: Diagnosis not present

## 2019-04-14 DIAGNOSIS — E782 Mixed hyperlipidemia: Secondary | ICD-10-CM | POA: Diagnosis not present

## 2019-04-14 DIAGNOSIS — R7301 Impaired fasting glucose: Secondary | ICD-10-CM | POA: Diagnosis not present

## 2019-04-14 DIAGNOSIS — K219 Gastro-esophageal reflux disease without esophagitis: Secondary | ICD-10-CM | POA: Diagnosis not present

## 2019-04-14 DIAGNOSIS — M19011 Primary osteoarthritis, right shoulder: Secondary | ICD-10-CM | POA: Diagnosis not present

## 2019-04-14 DIAGNOSIS — Z6829 Body mass index (BMI) 29.0-29.9, adult: Secondary | ICD-10-CM | POA: Diagnosis not present

## 2019-04-15 DIAGNOSIS — M25511 Pain in right shoulder: Secondary | ICD-10-CM | POA: Diagnosis not present

## 2019-04-15 DIAGNOSIS — M19011 Primary osteoarthritis, right shoulder: Secondary | ICD-10-CM | POA: Diagnosis not present

## 2019-04-15 DIAGNOSIS — M24011 Loose body in right shoulder: Secondary | ICD-10-CM | POA: Diagnosis not present

## 2019-04-15 DIAGNOSIS — M25411 Effusion, right shoulder: Secondary | ICD-10-CM | POA: Diagnosis not present

## 2019-04-15 DIAGNOSIS — S46811A Strain of other muscles, fascia and tendons at shoulder and upper arm level, right arm, initial encounter: Secondary | ICD-10-CM | POA: Diagnosis not present

## 2019-04-20 DIAGNOSIS — M75121 Complete rotator cuff tear or rupture of right shoulder, not specified as traumatic: Secondary | ICD-10-CM | POA: Diagnosis not present

## 2019-05-13 DIAGNOSIS — I10 Essential (primary) hypertension: Secondary | ICD-10-CM | POA: Diagnosis not present

## 2019-05-13 DIAGNOSIS — E782 Mixed hyperlipidemia: Secondary | ICD-10-CM | POA: Diagnosis not present

## 2019-05-18 DIAGNOSIS — Z9889 Other specified postprocedural states: Secondary | ICD-10-CM | POA: Diagnosis not present

## 2019-05-18 DIAGNOSIS — M25511 Pain in right shoulder: Secondary | ICD-10-CM | POA: Diagnosis not present

## 2019-05-21 DIAGNOSIS — M25511 Pain in right shoulder: Secondary | ICD-10-CM | POA: Diagnosis not present

## 2019-05-21 DIAGNOSIS — Z9889 Other specified postprocedural states: Secondary | ICD-10-CM | POA: Diagnosis not present

## 2019-05-25 DIAGNOSIS — M25511 Pain in right shoulder: Secondary | ICD-10-CM | POA: Diagnosis not present

## 2019-05-25 DIAGNOSIS — Z9889 Other specified postprocedural states: Secondary | ICD-10-CM | POA: Diagnosis not present

## 2019-05-28 DIAGNOSIS — Z9889 Other specified postprocedural states: Secondary | ICD-10-CM | POA: Diagnosis not present

## 2019-05-28 DIAGNOSIS — M25511 Pain in right shoulder: Secondary | ICD-10-CM | POA: Diagnosis not present

## 2019-06-01 DIAGNOSIS — Z9889 Other specified postprocedural states: Secondary | ICD-10-CM | POA: Diagnosis not present

## 2019-06-01 DIAGNOSIS — M25511 Pain in right shoulder: Secondary | ICD-10-CM | POA: Diagnosis not present

## 2019-06-04 DIAGNOSIS — M25511 Pain in right shoulder: Secondary | ICD-10-CM | POA: Diagnosis not present

## 2019-06-04 DIAGNOSIS — Z9889 Other specified postprocedural states: Secondary | ICD-10-CM | POA: Diagnosis not present

## 2019-06-08 DIAGNOSIS — Z9889 Other specified postprocedural states: Secondary | ICD-10-CM | POA: Diagnosis not present

## 2019-06-08 DIAGNOSIS — M25511 Pain in right shoulder: Secondary | ICD-10-CM | POA: Diagnosis not present

## 2019-06-10 DIAGNOSIS — Z9889 Other specified postprocedural states: Secondary | ICD-10-CM | POA: Diagnosis not present

## 2019-06-10 DIAGNOSIS — M25511 Pain in right shoulder: Secondary | ICD-10-CM | POA: Diagnosis not present

## 2019-06-11 DIAGNOSIS — E7849 Other hyperlipidemia: Secondary | ICD-10-CM | POA: Diagnosis not present

## 2019-06-11 DIAGNOSIS — K219 Gastro-esophageal reflux disease without esophagitis: Secondary | ICD-10-CM | POA: Diagnosis not present

## 2019-06-15 DIAGNOSIS — Z9889 Other specified postprocedural states: Secondary | ICD-10-CM | POA: Diagnosis not present

## 2019-06-15 DIAGNOSIS — M25511 Pain in right shoulder: Secondary | ICD-10-CM | POA: Diagnosis not present

## 2019-06-18 DIAGNOSIS — M25511 Pain in right shoulder: Secondary | ICD-10-CM | POA: Diagnosis not present

## 2019-06-18 DIAGNOSIS — Z9889 Other specified postprocedural states: Secondary | ICD-10-CM | POA: Diagnosis not present

## 2019-06-22 DIAGNOSIS — M25511 Pain in right shoulder: Secondary | ICD-10-CM | POA: Diagnosis not present

## 2019-06-22 DIAGNOSIS — Z9889 Other specified postprocedural states: Secondary | ICD-10-CM | POA: Diagnosis not present

## 2019-06-24 DIAGNOSIS — M25511 Pain in right shoulder: Secondary | ICD-10-CM | POA: Diagnosis not present

## 2019-06-24 DIAGNOSIS — Z9889 Other specified postprocedural states: Secondary | ICD-10-CM | POA: Diagnosis not present

## 2019-06-24 DIAGNOSIS — Z23 Encounter for immunization: Secondary | ICD-10-CM | POA: Diagnosis not present

## 2019-06-28 DIAGNOSIS — M170 Bilateral primary osteoarthritis of knee: Secondary | ICD-10-CM | POA: Diagnosis not present

## 2019-06-28 DIAGNOSIS — M1712 Unilateral primary osteoarthritis, left knee: Secondary | ICD-10-CM | POA: Diagnosis not present

## 2019-06-29 DIAGNOSIS — M25511 Pain in right shoulder: Secondary | ICD-10-CM | POA: Diagnosis not present

## 2019-06-29 DIAGNOSIS — Z9889 Other specified postprocedural states: Secondary | ICD-10-CM | POA: Diagnosis not present

## 2019-07-02 DIAGNOSIS — Z9889 Other specified postprocedural states: Secondary | ICD-10-CM | POA: Diagnosis not present

## 2019-07-02 DIAGNOSIS — M25511 Pain in right shoulder: Secondary | ICD-10-CM | POA: Diagnosis not present

## 2019-07-06 DIAGNOSIS — M25511 Pain in right shoulder: Secondary | ICD-10-CM | POA: Diagnosis not present

## 2019-07-06 DIAGNOSIS — Z9889 Other specified postprocedural states: Secondary | ICD-10-CM | POA: Diagnosis not present

## 2019-07-07 DIAGNOSIS — Z961 Presence of intraocular lens: Secondary | ICD-10-CM | POA: Diagnosis not present

## 2019-07-07 DIAGNOSIS — H43813 Vitreous degeneration, bilateral: Secondary | ICD-10-CM | POA: Diagnosis not present

## 2019-07-07 DIAGNOSIS — H52223 Regular astigmatism, bilateral: Secondary | ICD-10-CM | POA: Diagnosis not present

## 2019-07-07 DIAGNOSIS — H26493 Other secondary cataract, bilateral: Secondary | ICD-10-CM | POA: Diagnosis not present

## 2019-07-07 DIAGNOSIS — H5203 Hypermetropia, bilateral: Secondary | ICD-10-CM | POA: Diagnosis not present

## 2019-07-07 DIAGNOSIS — H524 Presbyopia: Secondary | ICD-10-CM | POA: Diagnosis not present

## 2019-07-08 DIAGNOSIS — M25511 Pain in right shoulder: Secondary | ICD-10-CM | POA: Diagnosis not present

## 2019-07-08 DIAGNOSIS — Z9889 Other specified postprocedural states: Secondary | ICD-10-CM | POA: Diagnosis not present

## 2019-07-13 DIAGNOSIS — M25511 Pain in right shoulder: Secondary | ICD-10-CM | POA: Diagnosis not present

## 2019-07-13 DIAGNOSIS — Z9889 Other specified postprocedural states: Secondary | ICD-10-CM | POA: Diagnosis not present

## 2019-07-16 DIAGNOSIS — Z9889 Other specified postprocedural states: Secondary | ICD-10-CM | POA: Diagnosis not present

## 2019-07-16 DIAGNOSIS — M25511 Pain in right shoulder: Secondary | ICD-10-CM | POA: Diagnosis not present

## 2019-07-20 DIAGNOSIS — Z9889 Other specified postprocedural states: Secondary | ICD-10-CM | POA: Diagnosis not present

## 2019-07-20 DIAGNOSIS — M25511 Pain in right shoulder: Secondary | ICD-10-CM | POA: Diagnosis not present

## 2019-07-23 DIAGNOSIS — Z23 Encounter for immunization: Secondary | ICD-10-CM | POA: Diagnosis not present

## 2019-07-23 DIAGNOSIS — Z9889 Other specified postprocedural states: Secondary | ICD-10-CM | POA: Diagnosis not present

## 2019-07-23 DIAGNOSIS — M25511 Pain in right shoulder: Secondary | ICD-10-CM | POA: Diagnosis not present

## 2019-07-26 DIAGNOSIS — M1712 Unilateral primary osteoarthritis, left knee: Secondary | ICD-10-CM | POA: Diagnosis not present

## 2019-07-27 DIAGNOSIS — Z9889 Other specified postprocedural states: Secondary | ICD-10-CM | POA: Diagnosis not present

## 2019-07-27 DIAGNOSIS — M25511 Pain in right shoulder: Secondary | ICD-10-CM | POA: Diagnosis not present

## 2019-07-30 DIAGNOSIS — M25511 Pain in right shoulder: Secondary | ICD-10-CM | POA: Diagnosis not present

## 2019-07-30 DIAGNOSIS — Z9889 Other specified postprocedural states: Secondary | ICD-10-CM | POA: Diagnosis not present

## 2019-08-04 DIAGNOSIS — Z9889 Other specified postprocedural states: Secondary | ICD-10-CM | POA: Diagnosis not present

## 2019-08-04 DIAGNOSIS — M25511 Pain in right shoulder: Secondary | ICD-10-CM | POA: Diagnosis not present

## 2019-08-24 DIAGNOSIS — M1712 Unilateral primary osteoarthritis, left knee: Secondary | ICD-10-CM | POA: Diagnosis not present

## 2019-08-24 DIAGNOSIS — R202 Paresthesia of skin: Secondary | ICD-10-CM | POA: Diagnosis not present

## 2019-09-01 DIAGNOSIS — S83242A Other tear of medial meniscus, current injury, left knee, initial encounter: Secondary | ICD-10-CM | POA: Diagnosis not present

## 2019-09-01 DIAGNOSIS — M1712 Unilateral primary osteoarthritis, left knee: Secondary | ICD-10-CM | POA: Diagnosis not present

## 2019-09-01 DIAGNOSIS — M25562 Pain in left knee: Secondary | ICD-10-CM | POA: Diagnosis not present

## 2019-09-01 DIAGNOSIS — M7122 Synovial cyst of popliteal space [Baker], left knee: Secondary | ICD-10-CM | POA: Diagnosis not present

## 2019-09-01 DIAGNOSIS — R936 Abnormal findings on diagnostic imaging of limbs: Secondary | ICD-10-CM | POA: Diagnosis not present

## 2019-09-07 DIAGNOSIS — M25511 Pain in right shoulder: Secondary | ICD-10-CM | POA: Diagnosis not present

## 2019-09-07 DIAGNOSIS — M79641 Pain in right hand: Secondary | ICD-10-CM | POA: Diagnosis not present

## 2019-09-07 DIAGNOSIS — G5603 Carpal tunnel syndrome, bilateral upper limbs: Secondary | ICD-10-CM | POA: Diagnosis not present

## 2019-09-07 DIAGNOSIS — M25569 Pain in unspecified knee: Secondary | ICD-10-CM | POA: Diagnosis not present

## 2019-09-08 DIAGNOSIS — M23204 Derangement of unspecified medial meniscus due to old tear or injury, left knee: Secondary | ICD-10-CM | POA: Diagnosis not present

## 2019-09-08 DIAGNOSIS — M1712 Unilateral primary osteoarthritis, left knee: Secondary | ICD-10-CM | POA: Diagnosis not present

## 2019-09-23 DIAGNOSIS — G5602 Carpal tunnel syndrome, left upper limb: Secondary | ICD-10-CM | POA: Diagnosis not present

## 2019-09-23 DIAGNOSIS — G5601 Carpal tunnel syndrome, right upper limb: Secondary | ICD-10-CM | POA: Diagnosis not present

## 2019-10-05 DIAGNOSIS — M1712 Unilateral primary osteoarthritis, left knee: Secondary | ICD-10-CM | POA: Diagnosis not present

## 2019-10-06 DIAGNOSIS — R739 Hyperglycemia, unspecified: Secondary | ICD-10-CM | POA: Diagnosis not present

## 2019-10-06 DIAGNOSIS — K219 Gastro-esophageal reflux disease without esophagitis: Secondary | ICD-10-CM | POA: Diagnosis not present

## 2019-10-06 DIAGNOSIS — E782 Mixed hyperlipidemia: Secondary | ICD-10-CM | POA: Diagnosis not present

## 2019-10-06 DIAGNOSIS — E7800 Pure hypercholesterolemia, unspecified: Secondary | ICD-10-CM | POA: Diagnosis not present

## 2019-10-06 DIAGNOSIS — I10 Essential (primary) hypertension: Secondary | ICD-10-CM | POA: Diagnosis not present

## 2019-10-12 DIAGNOSIS — R7301 Impaired fasting glucose: Secondary | ICD-10-CM | POA: Diagnosis not present

## 2019-10-12 DIAGNOSIS — Z0000 Encounter for general adult medical examination without abnormal findings: Secondary | ICD-10-CM | POA: Diagnosis not present

## 2019-10-12 DIAGNOSIS — Z6829 Body mass index (BMI) 29.0-29.9, adult: Secondary | ICD-10-CM | POA: Diagnosis not present

## 2019-10-12 DIAGNOSIS — Z1389 Encounter for screening for other disorder: Secondary | ICD-10-CM | POA: Diagnosis not present

## 2019-10-12 DIAGNOSIS — Z1331 Encounter for screening for depression: Secondary | ICD-10-CM | POA: Diagnosis not present

## 2019-10-12 DIAGNOSIS — E782 Mixed hyperlipidemia: Secondary | ICD-10-CM | POA: Diagnosis not present

## 2019-10-12 DIAGNOSIS — K219 Gastro-esophageal reflux disease without esophagitis: Secondary | ICD-10-CM | POA: Diagnosis not present

## 2019-10-18 DIAGNOSIS — G5603 Carpal tunnel syndrome, bilateral upper limbs: Secondary | ICD-10-CM | POA: Diagnosis not present

## 2019-10-22 DIAGNOSIS — Z01818 Encounter for other preprocedural examination: Secondary | ICD-10-CM | POA: Diagnosis not present

## 2019-10-26 DIAGNOSIS — G5601 Carpal tunnel syndrome, right upper limb: Secondary | ICD-10-CM | POA: Diagnosis not present

## 2019-10-26 DIAGNOSIS — Z87891 Personal history of nicotine dependence: Secondary | ICD-10-CM | POA: Diagnosis not present

## 2019-10-26 DIAGNOSIS — K449 Diaphragmatic hernia without obstruction or gangrene: Secondary | ICD-10-CM | POA: Diagnosis not present

## 2019-10-26 DIAGNOSIS — K219 Gastro-esophageal reflux disease without esophagitis: Secondary | ICD-10-CM | POA: Diagnosis not present

## 2019-10-26 DIAGNOSIS — E785 Hyperlipidemia, unspecified: Secondary | ICD-10-CM | POA: Diagnosis not present

## 2019-10-26 DIAGNOSIS — M25531 Pain in right wrist: Secondary | ICD-10-CM | POA: Diagnosis not present

## 2019-12-29 DIAGNOSIS — M170 Bilateral primary osteoarthritis of knee: Secondary | ICD-10-CM | POA: Diagnosis not present

## 2020-01-24 DIAGNOSIS — M25511 Pain in right shoulder: Secondary | ICD-10-CM | POA: Diagnosis not present

## 2020-01-28 DIAGNOSIS — Z23 Encounter for immunization: Secondary | ICD-10-CM | POA: Diagnosis not present

## 2020-03-27 DIAGNOSIS — Z23 Encounter for immunization: Secondary | ICD-10-CM | POA: Diagnosis not present

## 2020-03-30 DIAGNOSIS — M170 Bilateral primary osteoarthritis of knee: Secondary | ICD-10-CM | POA: Diagnosis not present

## 2020-04-10 DIAGNOSIS — R7301 Impaired fasting glucose: Secondary | ICD-10-CM | POA: Diagnosis not present

## 2020-04-10 DIAGNOSIS — E7800 Pure hypercholesterolemia, unspecified: Secondary | ICD-10-CM | POA: Diagnosis not present

## 2020-04-10 DIAGNOSIS — K219 Gastro-esophageal reflux disease without esophagitis: Secondary | ICD-10-CM | POA: Diagnosis not present

## 2020-04-10 DIAGNOSIS — I10 Essential (primary) hypertension: Secondary | ICD-10-CM | POA: Diagnosis not present

## 2020-04-10 DIAGNOSIS — E782 Mixed hyperlipidemia: Secondary | ICD-10-CM | POA: Diagnosis not present

## 2020-04-12 DIAGNOSIS — L309 Dermatitis, unspecified: Secondary | ICD-10-CM | POA: Diagnosis not present

## 2020-04-12 DIAGNOSIS — K219 Gastro-esophageal reflux disease without esophagitis: Secondary | ICD-10-CM | POA: Diagnosis not present

## 2020-04-12 DIAGNOSIS — L821 Other seborrheic keratosis: Secondary | ICD-10-CM | POA: Diagnosis not present

## 2020-04-12 DIAGNOSIS — L570 Actinic keratosis: Secondary | ICD-10-CM | POA: Diagnosis not present

## 2020-04-12 DIAGNOSIS — E782 Mixed hyperlipidemia: Secondary | ICD-10-CM | POA: Diagnosis not present

## 2020-04-12 DIAGNOSIS — E875 Hyperkalemia: Secondary | ICD-10-CM | POA: Diagnosis not present

## 2020-04-12 DIAGNOSIS — R7301 Impaired fasting glucose: Secondary | ICD-10-CM | POA: Diagnosis not present

## 2020-04-12 DIAGNOSIS — Z6829 Body mass index (BMI) 29.0-29.9, adult: Secondary | ICD-10-CM | POA: Diagnosis not present

## 2020-04-12 DIAGNOSIS — D485 Neoplasm of uncertain behavior of skin: Secondary | ICD-10-CM | POA: Diagnosis not present

## 2020-04-18 DIAGNOSIS — L280 Lichen simplex chronicus: Secondary | ICD-10-CM | POA: Diagnosis not present

## 2020-04-18 DIAGNOSIS — L821 Other seborrheic keratosis: Secondary | ICD-10-CM | POA: Diagnosis not present

## 2020-04-18 DIAGNOSIS — D485 Neoplasm of uncertain behavior of skin: Secondary | ICD-10-CM | POA: Diagnosis not present

## 2020-04-18 DIAGNOSIS — L2082 Flexural eczema: Secondary | ICD-10-CM | POA: Diagnosis not present

## 2020-04-18 DIAGNOSIS — D0339 Melanoma in situ of other parts of face: Secondary | ICD-10-CM | POA: Diagnosis not present

## 2020-05-03 DIAGNOSIS — D0339 Melanoma in situ of other parts of face: Secondary | ICD-10-CM | POA: Diagnosis not present

## 2020-05-04 DIAGNOSIS — C4331 Malignant melanoma of nose: Secondary | ICD-10-CM | POA: Diagnosis not present

## 2020-05-18 DIAGNOSIS — D499 Neoplasm of unspecified behavior of unspecified site: Secondary | ICD-10-CM | POA: Diagnosis not present

## 2020-05-18 DIAGNOSIS — L570 Actinic keratosis: Secondary | ICD-10-CM | POA: Diagnosis not present

## 2020-05-18 DIAGNOSIS — D485 Neoplasm of uncertain behavior of skin: Secondary | ICD-10-CM | POA: Diagnosis not present

## 2020-05-25 DIAGNOSIS — M25561 Pain in right knee: Secondary | ICD-10-CM | POA: Diagnosis not present

## 2020-05-25 DIAGNOSIS — M170 Bilateral primary osteoarthritis of knee: Secondary | ICD-10-CM | POA: Diagnosis not present

## 2020-05-25 DIAGNOSIS — M25562 Pain in left knee: Secondary | ICD-10-CM | POA: Diagnosis not present

## 2020-06-01 DIAGNOSIS — M25561 Pain in right knee: Secondary | ICD-10-CM | POA: Diagnosis not present

## 2020-06-01 DIAGNOSIS — M170 Bilateral primary osteoarthritis of knee: Secondary | ICD-10-CM | POA: Diagnosis not present

## 2020-06-01 DIAGNOSIS — D171 Benign lipomatous neoplasm of skin and subcutaneous tissue of trunk: Secondary | ICD-10-CM | POA: Diagnosis not present

## 2020-06-01 DIAGNOSIS — M25562 Pain in left knee: Secondary | ICD-10-CM | POA: Diagnosis not present

## 2020-06-01 DIAGNOSIS — D485 Neoplasm of uncertain behavior of skin: Secondary | ICD-10-CM | POA: Diagnosis not present

## 2020-06-15 DIAGNOSIS — M25562 Pain in left knee: Secondary | ICD-10-CM | POA: Diagnosis not present

## 2020-06-15 DIAGNOSIS — M170 Bilateral primary osteoarthritis of knee: Secondary | ICD-10-CM | POA: Diagnosis not present

## 2020-06-15 DIAGNOSIS — M25561 Pain in right knee: Secondary | ICD-10-CM | POA: Diagnosis not present

## 2020-06-21 DIAGNOSIS — L0231 Cutaneous abscess of buttock: Secondary | ICD-10-CM | POA: Diagnosis not present

## 2020-06-26 DIAGNOSIS — L03317 Cellulitis of buttock: Secondary | ICD-10-CM | POA: Diagnosis not present

## 2020-06-30 DIAGNOSIS — M170 Bilateral primary osteoarthritis of knee: Secondary | ICD-10-CM | POA: Diagnosis not present

## 2020-08-01 DIAGNOSIS — L723 Sebaceous cyst: Secondary | ICD-10-CM | POA: Diagnosis not present

## 2020-08-01 DIAGNOSIS — L03317 Cellulitis of buttock: Secondary | ICD-10-CM | POA: Diagnosis not present

## 2020-08-17 DIAGNOSIS — D485 Neoplasm of uncertain behavior of skin: Secondary | ICD-10-CM | POA: Diagnosis not present

## 2020-08-17 DIAGNOSIS — L720 Epidermal cyst: Secondary | ICD-10-CM | POA: Diagnosis not present

## 2020-09-23 DIAGNOSIS — Z23 Encounter for immunization: Secondary | ICD-10-CM | POA: Diagnosis not present

## 2020-09-28 DIAGNOSIS — M170 Bilateral primary osteoarthritis of knee: Secondary | ICD-10-CM | POA: Diagnosis not present

## 2020-11-06 DIAGNOSIS — I10 Essential (primary) hypertension: Secondary | ICD-10-CM | POA: Diagnosis not present

## 2020-11-06 DIAGNOSIS — K2100 Gastro-esophageal reflux disease with esophagitis, without bleeding: Secondary | ICD-10-CM | POA: Diagnosis not present

## 2020-11-06 DIAGNOSIS — E7849 Other hyperlipidemia: Secondary | ICD-10-CM | POA: Diagnosis not present

## 2020-11-06 DIAGNOSIS — E875 Hyperkalemia: Secondary | ICD-10-CM | POA: Diagnosis not present

## 2020-11-06 DIAGNOSIS — R5383 Other fatigue: Secondary | ICD-10-CM | POA: Diagnosis not present

## 2020-11-06 DIAGNOSIS — E782 Mixed hyperlipidemia: Secondary | ICD-10-CM | POA: Diagnosis not present

## 2020-11-10 DIAGNOSIS — Z23 Encounter for immunization: Secondary | ICD-10-CM | POA: Diagnosis not present

## 2020-11-10 DIAGNOSIS — E7849 Other hyperlipidemia: Secondary | ICD-10-CM | POA: Diagnosis not present

## 2020-11-10 DIAGNOSIS — D485 Neoplasm of uncertain behavior of skin: Secondary | ICD-10-CM | POA: Diagnosis not present

## 2020-11-10 DIAGNOSIS — E875 Hyperkalemia: Secondary | ICD-10-CM | POA: Diagnosis not present

## 2020-11-10 DIAGNOSIS — Z6829 Body mass index (BMI) 29.0-29.9, adult: Secondary | ICD-10-CM | POA: Diagnosis not present

## 2020-11-10 DIAGNOSIS — R7301 Impaired fasting glucose: Secondary | ICD-10-CM | POA: Diagnosis not present

## 2020-11-10 DIAGNOSIS — L309 Dermatitis, unspecified: Secondary | ICD-10-CM | POA: Diagnosis not present

## 2020-11-10 DIAGNOSIS — K219 Gastro-esophageal reflux disease without esophagitis: Secondary | ICD-10-CM | POA: Diagnosis not present

## 2020-11-15 DIAGNOSIS — Z1283 Encounter for screening for malignant neoplasm of skin: Secondary | ICD-10-CM | POA: Diagnosis not present

## 2020-11-15 DIAGNOSIS — L570 Actinic keratosis: Secondary | ICD-10-CM | POA: Diagnosis not present

## 2020-11-15 DIAGNOSIS — Z85820 Personal history of malignant melanoma of skin: Secondary | ICD-10-CM | POA: Diagnosis not present

## 2020-11-15 DIAGNOSIS — L814 Other melanin hyperpigmentation: Secondary | ICD-10-CM | POA: Diagnosis not present

## 2020-11-23 DIAGNOSIS — M170 Bilateral primary osteoarthritis of knee: Secondary | ICD-10-CM | POA: Diagnosis not present

## 2020-11-24 DIAGNOSIS — H47093 Other disorders of optic nerve, not elsewhere classified, bilateral: Secondary | ICD-10-CM | POA: Diagnosis not present

## 2020-12-01 DIAGNOSIS — S83511A Sprain of anterior cruciate ligament of right knee, initial encounter: Secondary | ICD-10-CM | POA: Diagnosis not present

## 2020-12-01 DIAGNOSIS — S83521A Sprain of posterior cruciate ligament of right knee, initial encounter: Secondary | ICD-10-CM | POA: Diagnosis not present

## 2020-12-01 DIAGNOSIS — M948X6 Other specified disorders of cartilage, lower leg: Secondary | ICD-10-CM | POA: Diagnosis not present

## 2020-12-01 DIAGNOSIS — Z01818 Encounter for other preprocedural examination: Secondary | ICD-10-CM | POA: Diagnosis not present

## 2020-12-01 DIAGNOSIS — G8929 Other chronic pain: Secondary | ICD-10-CM | POA: Diagnosis not present

## 2020-12-01 DIAGNOSIS — M23221 Derangement of posterior horn of medial meniscus due to old tear or injury, right knee: Secondary | ICD-10-CM | POA: Diagnosis not present

## 2020-12-01 DIAGNOSIS — M1711 Unilateral primary osteoarthritis, right knee: Secondary | ICD-10-CM | POA: Diagnosis not present

## 2020-12-01 DIAGNOSIS — M25461 Effusion, right knee: Secondary | ICD-10-CM | POA: Diagnosis not present

## 2020-12-01 DIAGNOSIS — M7121 Synovial cyst of popliteal space [Baker], right knee: Secondary | ICD-10-CM | POA: Diagnosis not present

## 2020-12-01 DIAGNOSIS — S83241A Other tear of medial meniscus, current injury, right knee, initial encounter: Secondary | ICD-10-CM | POA: Diagnosis not present

## 2020-12-01 DIAGNOSIS — Z01812 Encounter for preprocedural laboratory examination: Secondary | ICD-10-CM | POA: Diagnosis not present

## 2020-12-01 DIAGNOSIS — R6889 Other general symptoms and signs: Secondary | ICD-10-CM | POA: Diagnosis not present

## 2020-12-07 DIAGNOSIS — M1711 Unilateral primary osteoarthritis, right knee: Secondary | ICD-10-CM | POA: Diagnosis not present

## 2020-12-28 DIAGNOSIS — M1711 Unilateral primary osteoarthritis, right knee: Secondary | ICD-10-CM | POA: Diagnosis not present

## 2021-01-08 DIAGNOSIS — M170 Bilateral primary osteoarthritis of knee: Secondary | ICD-10-CM | POA: Diagnosis not present

## 2021-01-08 DIAGNOSIS — Z01818 Encounter for other preprocedural examination: Secondary | ICD-10-CM | POA: Diagnosis not present

## 2021-01-09 DIAGNOSIS — J9811 Atelectasis: Secondary | ICD-10-CM | POA: Diagnosis not present

## 2021-01-09 DIAGNOSIS — Z01818 Encounter for other preprocedural examination: Secondary | ICD-10-CM | POA: Diagnosis not present

## 2021-01-09 DIAGNOSIS — Z01812 Encounter for preprocedural laboratory examination: Secondary | ICD-10-CM | POA: Diagnosis not present

## 2021-01-09 DIAGNOSIS — M542 Cervicalgia: Secondary | ICD-10-CM | POA: Diagnosis not present

## 2021-01-10 DIAGNOSIS — J111 Influenza due to unidentified influenza virus with other respiratory manifestations: Secondary | ICD-10-CM | POA: Diagnosis not present

## 2021-01-10 DIAGNOSIS — Z20822 Contact with and (suspected) exposure to covid-19: Secondary | ICD-10-CM | POA: Diagnosis not present

## 2021-01-10 DIAGNOSIS — M5030 Other cervical disc degeneration, unspecified cervical region: Secondary | ICD-10-CM | POA: Diagnosis not present

## 2021-01-10 DIAGNOSIS — R700 Elevated erythrocyte sedimentation rate: Secondary | ICD-10-CM | POA: Diagnosis not present

## 2021-01-10 DIAGNOSIS — M47812 Spondylosis without myelopathy or radiculopathy, cervical region: Secondary | ICD-10-CM | POA: Diagnosis not present

## 2021-01-10 DIAGNOSIS — R509 Fever, unspecified: Secondary | ICD-10-CM | POA: Diagnosis not present

## 2021-01-10 DIAGNOSIS — M542 Cervicalgia: Secondary | ICD-10-CM | POA: Diagnosis not present

## 2021-01-10 DIAGNOSIS — Z87891 Personal history of nicotine dependence: Secondary | ICD-10-CM | POA: Diagnosis not present

## 2021-01-10 DIAGNOSIS — Z6828 Body mass index (BMI) 28.0-28.9, adult: Secondary | ICD-10-CM | POA: Diagnosis not present

## 2021-01-10 DIAGNOSIS — R7982 Elevated C-reactive protein (CRP): Secondary | ICD-10-CM | POA: Diagnosis not present

## 2021-01-10 DIAGNOSIS — M5090 Cervical disc disorder, unspecified, unspecified cervical region: Secondary | ICD-10-CM | POA: Diagnosis not present

## 2021-01-23 DIAGNOSIS — Z6828 Body mass index (BMI) 28.0-28.9, adult: Secondary | ICD-10-CM | POA: Diagnosis not present

## 2021-01-23 DIAGNOSIS — R030 Elevated blood-pressure reading, without diagnosis of hypertension: Secondary | ICD-10-CM | POA: Diagnosis not present

## 2021-01-23 DIAGNOSIS — M4722 Other spondylosis with radiculopathy, cervical region: Secondary | ICD-10-CM | POA: Diagnosis not present

## 2021-01-24 DIAGNOSIS — E782 Mixed hyperlipidemia: Secondary | ICD-10-CM | POA: Diagnosis not present

## 2021-01-24 DIAGNOSIS — R7301 Impaired fasting glucose: Secondary | ICD-10-CM | POA: Diagnosis not present

## 2021-01-24 DIAGNOSIS — E7849 Other hyperlipidemia: Secondary | ICD-10-CM | POA: Diagnosis not present

## 2021-01-24 DIAGNOSIS — E7800 Pure hypercholesterolemia, unspecified: Secondary | ICD-10-CM | POA: Diagnosis not present

## 2021-01-24 DIAGNOSIS — M542 Cervicalgia: Secondary | ICD-10-CM | POA: Diagnosis not present

## 2021-01-24 DIAGNOSIS — R700 Elevated erythrocyte sedimentation rate: Secondary | ICD-10-CM | POA: Diagnosis not present

## 2021-01-24 DIAGNOSIS — M9971 Connective tissue and disc stenosis of intervertebral foramina of cervical region: Secondary | ICD-10-CM | POA: Diagnosis not present

## 2021-01-24 DIAGNOSIS — M4722 Other spondylosis with radiculopathy, cervical region: Secondary | ICD-10-CM | POA: Diagnosis not present

## 2021-01-24 DIAGNOSIS — E7801 Familial hypercholesterolemia: Secondary | ICD-10-CM | POA: Diagnosis not present

## 2021-01-24 DIAGNOSIS — R7982 Elevated C-reactive protein (CRP): Secondary | ICD-10-CM | POA: Diagnosis not present

## 2021-01-24 DIAGNOSIS — M4802 Spinal stenosis, cervical region: Secondary | ICD-10-CM | POA: Diagnosis not present

## 2021-01-24 DIAGNOSIS — R5383 Other fatigue: Secondary | ICD-10-CM | POA: Diagnosis not present

## 2021-01-24 DIAGNOSIS — E875 Hyperkalemia: Secondary | ICD-10-CM | POA: Diagnosis not present

## 2021-02-05 DIAGNOSIS — Z23 Encounter for immunization: Secondary | ICD-10-CM | POA: Diagnosis not present

## 2021-02-05 DIAGNOSIS — Z6828 Body mass index (BMI) 28.0-28.9, adult: Secondary | ICD-10-CM | POA: Diagnosis not present

## 2021-02-05 DIAGNOSIS — R030 Elevated blood-pressure reading, without diagnosis of hypertension: Secondary | ICD-10-CM | POA: Diagnosis not present

## 2021-02-05 DIAGNOSIS — M4722 Other spondylosis with radiculopathy, cervical region: Secondary | ICD-10-CM | POA: Diagnosis not present

## 2021-02-07 DIAGNOSIS — Z23 Encounter for immunization: Secondary | ICD-10-CM | POA: Diagnosis not present

## 2021-02-15 DIAGNOSIS — Z01818 Encounter for other preprocedural examination: Secondary | ICD-10-CM | POA: Diagnosis not present

## 2021-02-15 DIAGNOSIS — M1711 Unilateral primary osteoarthritis, right knee: Secondary | ICD-10-CM | POA: Diagnosis not present

## 2021-03-14 DIAGNOSIS — Z7982 Long term (current) use of aspirin: Secondary | ICD-10-CM | POA: Diagnosis not present

## 2021-03-14 DIAGNOSIS — Z87891 Personal history of nicotine dependence: Secondary | ICD-10-CM | POA: Diagnosis not present

## 2021-03-14 DIAGNOSIS — Z791 Long term (current) use of non-steroidal anti-inflammatories (NSAID): Secondary | ICD-10-CM | POA: Diagnosis not present

## 2021-03-14 DIAGNOSIS — Z96651 Presence of right artificial knee joint: Secondary | ICD-10-CM | POA: Diagnosis not present

## 2021-03-14 DIAGNOSIS — M7989 Other specified soft tissue disorders: Secondary | ICD-10-CM | POA: Diagnosis not present

## 2021-03-14 DIAGNOSIS — M170 Bilateral primary osteoarthritis of knee: Secondary | ICD-10-CM | POA: Diagnosis not present

## 2021-03-14 DIAGNOSIS — K219 Gastro-esophageal reflux disease without esophagitis: Secondary | ICD-10-CM | POA: Diagnosis present

## 2021-03-14 DIAGNOSIS — M1711 Unilateral primary osteoarthritis, right knee: Secondary | ICD-10-CM | POA: Diagnosis present

## 2021-03-14 DIAGNOSIS — Z01818 Encounter for other preprocedural examination: Secondary | ICD-10-CM | POA: Diagnosis not present

## 2021-03-14 DIAGNOSIS — E785 Hyperlipidemia, unspecified: Secondary | ICD-10-CM | POA: Diagnosis present

## 2021-03-14 DIAGNOSIS — Z20822 Contact with and (suspected) exposure to covid-19: Secondary | ICD-10-CM | POA: Diagnosis present

## 2021-03-20 DIAGNOSIS — Z96651 Presence of right artificial knee joint: Secondary | ICD-10-CM | POA: Diagnosis not present

## 2021-03-20 DIAGNOSIS — M25661 Stiffness of right knee, not elsewhere classified: Secondary | ICD-10-CM | POA: Diagnosis not present

## 2021-03-20 DIAGNOSIS — R29898 Other symptoms and signs involving the musculoskeletal system: Secondary | ICD-10-CM | POA: Diagnosis not present

## 2021-03-20 DIAGNOSIS — M170 Bilateral primary osteoarthritis of knee: Secondary | ICD-10-CM | POA: Diagnosis not present

## 2021-03-20 DIAGNOSIS — M25562 Pain in left knee: Secondary | ICD-10-CM | POA: Diagnosis not present

## 2021-03-20 DIAGNOSIS — Z9181 History of falling: Secondary | ICD-10-CM | POA: Diagnosis not present

## 2021-03-20 DIAGNOSIS — G8929 Other chronic pain: Secondary | ICD-10-CM | POA: Diagnosis not present

## 2021-03-20 DIAGNOSIS — M25561 Pain in right knee: Secondary | ICD-10-CM | POA: Diagnosis not present

## 2021-03-20 DIAGNOSIS — M25662 Stiffness of left knee, not elsewhere classified: Secondary | ICD-10-CM | POA: Diagnosis not present

## 2021-03-20 DIAGNOSIS — R262 Difficulty in walking, not elsewhere classified: Secondary | ICD-10-CM | POA: Diagnosis not present

## 2021-03-22 DIAGNOSIS — G8929 Other chronic pain: Secondary | ICD-10-CM | POA: Diagnosis not present

## 2021-03-22 DIAGNOSIS — M170 Bilateral primary osteoarthritis of knee: Secondary | ICD-10-CM | POA: Diagnosis not present

## 2021-03-22 DIAGNOSIS — M25561 Pain in right knee: Secondary | ICD-10-CM | POA: Diagnosis not present

## 2021-03-22 DIAGNOSIS — M25562 Pain in left knee: Secondary | ICD-10-CM | POA: Diagnosis not present

## 2021-03-22 DIAGNOSIS — M25662 Stiffness of left knee, not elsewhere classified: Secondary | ICD-10-CM | POA: Diagnosis not present

## 2021-03-22 DIAGNOSIS — M25661 Stiffness of right knee, not elsewhere classified: Secondary | ICD-10-CM | POA: Diagnosis not present

## 2021-03-27 DIAGNOSIS — M25561 Pain in right knee: Secondary | ICD-10-CM | POA: Diagnosis not present

## 2021-03-27 DIAGNOSIS — G8929 Other chronic pain: Secondary | ICD-10-CM | POA: Diagnosis not present

## 2021-03-27 DIAGNOSIS — M25562 Pain in left knee: Secondary | ICD-10-CM | POA: Diagnosis not present

## 2021-03-27 DIAGNOSIS — M170 Bilateral primary osteoarthritis of knee: Secondary | ICD-10-CM | POA: Diagnosis not present

## 2021-03-27 DIAGNOSIS — M25661 Stiffness of right knee, not elsewhere classified: Secondary | ICD-10-CM | POA: Diagnosis not present

## 2021-03-27 DIAGNOSIS — M25662 Stiffness of left knee, not elsewhere classified: Secondary | ICD-10-CM | POA: Diagnosis not present

## 2021-03-29 DIAGNOSIS — Z96651 Presence of right artificial knee joint: Secondary | ICD-10-CM | POA: Diagnosis not present

## 2021-03-30 DIAGNOSIS — M25661 Stiffness of right knee, not elsewhere classified: Secondary | ICD-10-CM | POA: Diagnosis not present

## 2021-03-30 DIAGNOSIS — M25562 Pain in left knee: Secondary | ICD-10-CM | POA: Diagnosis not present

## 2021-03-30 DIAGNOSIS — G8929 Other chronic pain: Secondary | ICD-10-CM | POA: Diagnosis not present

## 2021-03-30 DIAGNOSIS — M25662 Stiffness of left knee, not elsewhere classified: Secondary | ICD-10-CM | POA: Diagnosis not present

## 2021-03-30 DIAGNOSIS — M170 Bilateral primary osteoarthritis of knee: Secondary | ICD-10-CM | POA: Diagnosis not present

## 2021-03-30 DIAGNOSIS — M25561 Pain in right knee: Secondary | ICD-10-CM | POA: Diagnosis not present

## 2021-04-02 DIAGNOSIS — M25561 Pain in right knee: Secondary | ICD-10-CM | POA: Diagnosis not present

## 2021-04-02 DIAGNOSIS — M25661 Stiffness of right knee, not elsewhere classified: Secondary | ICD-10-CM | POA: Diagnosis not present

## 2021-04-02 DIAGNOSIS — M25662 Stiffness of left knee, not elsewhere classified: Secondary | ICD-10-CM | POA: Diagnosis not present

## 2021-04-02 DIAGNOSIS — G8929 Other chronic pain: Secondary | ICD-10-CM | POA: Diagnosis not present

## 2021-04-02 DIAGNOSIS — M25562 Pain in left knee: Secondary | ICD-10-CM | POA: Diagnosis not present

## 2021-04-02 DIAGNOSIS — M170 Bilateral primary osteoarthritis of knee: Secondary | ICD-10-CM | POA: Diagnosis not present

## 2021-04-04 DIAGNOSIS — M25562 Pain in left knee: Secondary | ICD-10-CM | POA: Diagnosis not present

## 2021-04-04 DIAGNOSIS — M25561 Pain in right knee: Secondary | ICD-10-CM | POA: Diagnosis not present

## 2021-04-04 DIAGNOSIS — G8929 Other chronic pain: Secondary | ICD-10-CM | POA: Diagnosis not present

## 2021-04-04 DIAGNOSIS — M25662 Stiffness of left knee, not elsewhere classified: Secondary | ICD-10-CM | POA: Diagnosis not present

## 2021-04-04 DIAGNOSIS — M25661 Stiffness of right knee, not elsewhere classified: Secondary | ICD-10-CM | POA: Diagnosis not present

## 2021-04-04 DIAGNOSIS — M170 Bilateral primary osteoarthritis of knee: Secondary | ICD-10-CM | POA: Diagnosis not present

## 2021-04-10 DIAGNOSIS — G8929 Other chronic pain: Secondary | ICD-10-CM | POA: Diagnosis not present

## 2021-04-10 DIAGNOSIS — M25561 Pain in right knee: Secondary | ICD-10-CM | POA: Diagnosis not present

## 2021-04-10 DIAGNOSIS — M25562 Pain in left knee: Secondary | ICD-10-CM | POA: Diagnosis not present

## 2021-04-10 DIAGNOSIS — M25662 Stiffness of left knee, not elsewhere classified: Secondary | ICD-10-CM | POA: Diagnosis not present

## 2021-04-10 DIAGNOSIS — M170 Bilateral primary osteoarthritis of knee: Secondary | ICD-10-CM | POA: Diagnosis not present

## 2021-04-10 DIAGNOSIS — M25661 Stiffness of right knee, not elsewhere classified: Secondary | ICD-10-CM | POA: Diagnosis not present

## 2021-04-12 DIAGNOSIS — M25562 Pain in left knee: Secondary | ICD-10-CM | POA: Diagnosis not present

## 2021-04-12 DIAGNOSIS — M25561 Pain in right knee: Secondary | ICD-10-CM | POA: Diagnosis not present

## 2021-04-12 DIAGNOSIS — M170 Bilateral primary osteoarthritis of knee: Secondary | ICD-10-CM | POA: Diagnosis not present

## 2021-04-12 DIAGNOSIS — G8929 Other chronic pain: Secondary | ICD-10-CM | POA: Diagnosis not present

## 2021-04-12 DIAGNOSIS — M25662 Stiffness of left knee, not elsewhere classified: Secondary | ICD-10-CM | POA: Diagnosis not present

## 2021-04-12 DIAGNOSIS — M25661 Stiffness of right knee, not elsewhere classified: Secondary | ICD-10-CM | POA: Diagnosis not present

## 2021-04-17 DIAGNOSIS — G8929 Other chronic pain: Secondary | ICD-10-CM | POA: Diagnosis not present

## 2021-04-17 DIAGNOSIS — M25662 Stiffness of left knee, not elsewhere classified: Secondary | ICD-10-CM | POA: Diagnosis not present

## 2021-04-17 DIAGNOSIS — M25561 Pain in right knee: Secondary | ICD-10-CM | POA: Diagnosis not present

## 2021-04-17 DIAGNOSIS — M25562 Pain in left knee: Secondary | ICD-10-CM | POA: Diagnosis not present

## 2021-04-17 DIAGNOSIS — M25661 Stiffness of right knee, not elsewhere classified: Secondary | ICD-10-CM | POA: Diagnosis not present

## 2021-04-17 DIAGNOSIS — M170 Bilateral primary osteoarthritis of knee: Secondary | ICD-10-CM | POA: Diagnosis not present

## 2021-04-19 DIAGNOSIS — Z9181 History of falling: Secondary | ICD-10-CM | POA: Diagnosis not present

## 2021-04-19 DIAGNOSIS — Z96651 Presence of right artificial knee joint: Secondary | ICD-10-CM | POA: Diagnosis not present

## 2021-04-19 DIAGNOSIS — R29898 Other symptoms and signs involving the musculoskeletal system: Secondary | ICD-10-CM | POA: Diagnosis not present

## 2021-04-19 DIAGNOSIS — M25561 Pain in right knee: Secondary | ICD-10-CM | POA: Diagnosis not present

## 2021-04-19 DIAGNOSIS — M25662 Stiffness of left knee, not elsewhere classified: Secondary | ICD-10-CM | POA: Diagnosis not present

## 2021-04-19 DIAGNOSIS — M25661 Stiffness of right knee, not elsewhere classified: Secondary | ICD-10-CM | POA: Diagnosis not present

## 2021-04-19 DIAGNOSIS — G8929 Other chronic pain: Secondary | ICD-10-CM | POA: Diagnosis not present

## 2021-04-19 DIAGNOSIS — M170 Bilateral primary osteoarthritis of knee: Secondary | ICD-10-CM | POA: Diagnosis not present

## 2021-04-19 DIAGNOSIS — R262 Difficulty in walking, not elsewhere classified: Secondary | ICD-10-CM | POA: Diagnosis not present

## 2021-04-19 DIAGNOSIS — M25562 Pain in left knee: Secondary | ICD-10-CM | POA: Diagnosis not present

## 2021-04-24 DIAGNOSIS — G8929 Other chronic pain: Secondary | ICD-10-CM | POA: Diagnosis not present

## 2021-04-24 DIAGNOSIS — M170 Bilateral primary osteoarthritis of knee: Secondary | ICD-10-CM | POA: Diagnosis not present

## 2021-04-24 DIAGNOSIS — M25561 Pain in right knee: Secondary | ICD-10-CM | POA: Diagnosis not present

## 2021-04-24 DIAGNOSIS — M25661 Stiffness of right knee, not elsewhere classified: Secondary | ICD-10-CM | POA: Diagnosis not present

## 2021-04-24 DIAGNOSIS — M25562 Pain in left knee: Secondary | ICD-10-CM | POA: Diagnosis not present

## 2021-04-24 DIAGNOSIS — M25662 Stiffness of left knee, not elsewhere classified: Secondary | ICD-10-CM | POA: Diagnosis not present

## 2021-04-26 DIAGNOSIS — M25562 Pain in left knee: Secondary | ICD-10-CM | POA: Diagnosis not present

## 2021-04-26 DIAGNOSIS — M25662 Stiffness of left knee, not elsewhere classified: Secondary | ICD-10-CM | POA: Diagnosis not present

## 2021-04-26 DIAGNOSIS — M170 Bilateral primary osteoarthritis of knee: Secondary | ICD-10-CM | POA: Diagnosis not present

## 2021-04-26 DIAGNOSIS — M25661 Stiffness of right knee, not elsewhere classified: Secondary | ICD-10-CM | POA: Diagnosis not present

## 2021-04-26 DIAGNOSIS — M25561 Pain in right knee: Secondary | ICD-10-CM | POA: Diagnosis not present

## 2021-04-26 DIAGNOSIS — G8929 Other chronic pain: Secondary | ICD-10-CM | POA: Diagnosis not present

## 2021-04-26 DIAGNOSIS — Z96651 Presence of right artificial knee joint: Secondary | ICD-10-CM | POA: Diagnosis not present

## 2021-05-09 DIAGNOSIS — Z0000 Encounter for general adult medical examination without abnormal findings: Secondary | ICD-10-CM | POA: Diagnosis not present

## 2021-05-09 DIAGNOSIS — Z01818 Encounter for other preprocedural examination: Secondary | ICD-10-CM | POA: Diagnosis not present

## 2021-05-09 DIAGNOSIS — R739 Hyperglycemia, unspecified: Secondary | ICD-10-CM | POA: Diagnosis not present

## 2021-05-15 DIAGNOSIS — D485 Neoplasm of uncertain behavior of skin: Secondary | ICD-10-CM | POA: Diagnosis not present

## 2021-05-15 DIAGNOSIS — R7301 Impaired fasting glucose: Secondary | ICD-10-CM | POA: Diagnosis not present

## 2021-05-15 DIAGNOSIS — K219 Gastro-esophageal reflux disease without esophagitis: Secondary | ICD-10-CM | POA: Diagnosis not present

## 2021-05-15 DIAGNOSIS — L570 Actinic keratosis: Secondary | ICD-10-CM | POA: Diagnosis not present

## 2021-05-15 DIAGNOSIS — M1712 Unilateral primary osteoarthritis, left knee: Secondary | ICD-10-CM | POA: Diagnosis not present

## 2021-05-15 DIAGNOSIS — L821 Other seborrheic keratosis: Secondary | ICD-10-CM | POA: Diagnosis not present

## 2021-05-15 DIAGNOSIS — E7849 Other hyperlipidemia: Secondary | ICD-10-CM | POA: Diagnosis not present

## 2021-05-15 DIAGNOSIS — E875 Hyperkalemia: Secondary | ICD-10-CM | POA: Diagnosis not present

## 2021-05-15 DIAGNOSIS — L309 Dermatitis, unspecified: Secondary | ICD-10-CM | POA: Diagnosis not present

## 2021-05-15 DIAGNOSIS — M25562 Pain in left knee: Secondary | ICD-10-CM | POA: Diagnosis not present

## 2021-05-22 DIAGNOSIS — M25462 Effusion, left knee: Secondary | ICD-10-CM | POA: Diagnosis not present

## 2021-05-22 DIAGNOSIS — Z01818 Encounter for other preprocedural examination: Secondary | ICD-10-CM | POA: Diagnosis not present

## 2021-05-22 DIAGNOSIS — M1712 Unilateral primary osteoarthritis, left knee: Secondary | ICD-10-CM | POA: Diagnosis not present

## 2021-05-23 DIAGNOSIS — Z01818 Encounter for other preprocedural examination: Secondary | ICD-10-CM | POA: Diagnosis not present

## 2021-05-23 DIAGNOSIS — M1712 Unilateral primary osteoarthritis, left knee: Secondary | ICD-10-CM | POA: Diagnosis not present

## 2021-05-23 DIAGNOSIS — L280 Lichen simplex chronicus: Secondary | ICD-10-CM | POA: Diagnosis not present

## 2021-05-23 DIAGNOSIS — L570 Actinic keratosis: Secondary | ICD-10-CM | POA: Diagnosis not present

## 2021-05-23 DIAGNOSIS — D485 Neoplasm of uncertain behavior of skin: Secondary | ICD-10-CM | POA: Diagnosis not present

## 2021-05-23 DIAGNOSIS — L308 Other specified dermatitis: Secondary | ICD-10-CM | POA: Diagnosis not present

## 2021-05-23 DIAGNOSIS — Z85820 Personal history of malignant melanoma of skin: Secondary | ICD-10-CM | POA: Diagnosis not present

## 2021-06-06 ENCOUNTER — Encounter: Payer: Self-pay | Admitting: Surgery

## 2021-06-06 ENCOUNTER — Other Ambulatory Visit: Payer: Self-pay

## 2021-06-06 ENCOUNTER — Ambulatory Visit (INDEPENDENT_AMBULATORY_CARE_PROVIDER_SITE_OTHER): Payer: Medicare Other | Admitting: Surgery

## 2021-06-06 VITALS — BP 137/80 | HR 72 | Temp 98.6°F | Resp 14 | Ht 73.0 in | Wt 211.0 lb

## 2021-06-06 DIAGNOSIS — K436 Other and unspecified ventral hernia with obstruction, without gangrene: Secondary | ICD-10-CM

## 2021-06-06 NOTE — Progress Notes (Signed)
[facility] History and Physical    Chief Complaint   New Patient (Initial Visit)     Harold Payne. is a 71 y.o. male.  HPI: Patient presents for evaluation of his right incisional ventral hernia.  He underwent colectomy in the past for a cancerous polyp noted on colonoscopy.  He subsequently developed an incisional hernia at his surgical site.  He has previously been seen for the hernia, but was told given its size, it was at low risk for incarceration.  Recently, within the last 2 weeks, he has had increased pain associated with his hernia, which led to him following up for evaluation.  He denies nausea, vomiting, and obstipation.  He is passing flatus and having regular bowel movements.  He denies trying to reduce the hernia, but states that it is normally soft.  His other surgical history besides his colectomy include cholecystectomy and multiple joint replacements.  He denies use of blood thinning medications.  He denies smoking, occasionally will drink alcohol, and denies illicit drug use.  Past Medical History:  Diagnosis Date   Arthritis     Past Surgical History:  Procedure Laterality Date   CHOLECYSTECTOMY     COLON SURGERY     COLONOSCOPY     COLONOSCOPY N/A 07/20/2013   Procedure: COLONOSCOPY;  Surgeon: Jamesetta So, MD;  Location: [facility];  Service: Gastroenterology;  Laterality: N/A;   COLONOSCOPY N/A 02/16/2019   Procedure: COLONOSCOPY;  Surgeon: Aviva Signs, MD;  Location: [facility];  Service: Gastroenterology;  Laterality: N/A;  7:30am   ELBOW BURSA SURGERY Left    HYDROCELE EXCISION     KNEE ARTHROSCOPY W/ DEBRIDEMENT      No family history on file.  Social History   Tobacco Use   Smoking status: Never   Smokeless tobacco: Never  Vaping Use   Vaping Use: Never used  Substance Use Topics   Alcohol use: Yes    Comment: once a week   Drug use: No    Medications: I have reviewed the patient's current  medications. Allergies as of 06/06/2021   No Known Allergies      Medication List        Accurate as of June 06, 2021  4:01 PM. If you have any questions, ask your nurse or doctor.          STOP taking these medications    diclofenac 75 MG EC tablet Commonly known as: VOLTAREN Stopped by: Mashal Slavick A Helton Oleson, DO   ibuprofen 200 MG tablet Commonly known as: ADVIL Stopped by: Furious Chiarelli A Robinson Brinkley, DO       TAKE these medications    aspirin EC 81 MG tablet Take 81 mg by mouth daily.   atorvastatin 10 MG tablet Commonly known as: LIPITOR Take 5 mg by mouth daily.   CENTRUM PO Take 1 tablet by mouth daily.   RABEprazole 20 MG tablet Commonly known as: ACIPHEX Take 20 mg by mouth daily.         ROS:  Constitutional: negative for chills, fatigue, and fevers Respiratory: negative for cough, wheezing, and shortness of breath Cardiovascular: negative for chest pain and palpitations Gastrointestinal: positive for abdominal pain associated with incisional hernia, negative for constipation, diarrhea, nausea, and vomiting Integument/breast: negative for dryness and rash Musculoskeletal:positive for joint pain, negative for back pain and neck pain  Blood pressure 137/80, pulse 72, temperature 98.6 F (37 C), temperature source Other (Comment), resp. rate 14, height 6\' 1"  (1.854  m), weight 211 lb (95.7 kg), SpO2 93 %. Physical Exam Vitals reviewed.  Constitutional:      Appearance: Normal appearance.  HENT:     Head: Normocephalic and atraumatic.  Eyes:     Extraocular Movements: Extraocular movements intact.     Pupils: Pupils are equal, round, and reactive to light.  Cardiovascular:     Rate and Rhythm: Normal rate and regular rhythm.  Pulmonary:     Effort: Pulmonary effort is normal.     Breath sounds: Normal breath sounds.  Abdominal:     Comments: Soft, nondistended, no percussion tenderness, no tenderness to palpation, excluding right-sided  ventral/incisional hernia; 2 palpable fascial defects associated with right-sided scar, both hernias soft and reducible, though the larger more inferior 1 did result with some tenderness during reduction; no rigidity, guarding, rebound tenderness  Musculoskeletal:        General: Normal range of motion.     Cervical back: Normal range of motion.  Skin:    General: Skin is warm and dry.  Neurological:     General: No focal deficit present.     Mental Status: He is alert and oriented to person, place, and time.  Psychiatric:        Mood and Affect: Mood normal.        Behavior: Behavior normal.    Results: No results found for this or any previous visit (from the past 48 hour(s)).  No results found.   Assessment & Plan:  Harold Jarosz. is a 71 y.o. male who presents for evaluation of his ventral/incisional hernia repair which he developed after a colectomy for a cancerous polyp.  -Given the size of the patient's ventral hernia and multiple fascial defects able to be palpated, recommend CT abdomen and pelvis to fully evaluate abdominal wall to determine the best surgical option for him.  Given the size of his hernia and at least 1 other defect able to be palpated, he may require evaluation with a hernia specialist for possible abdominal wall reconstruction -Discussed with patient that what ever hernia repair he needs, he require mesh.  I further stated that we can go into the details of his potential surgery once we make a determination about what is the best method for repair of his hernia -Given that he does not have any obstructive symptoms secondary to his hernia, I did tell him he could proceed with knee replacement that he previously had scheduled -Follow up after CT abdomen and pelvis to discuss surgical options  All questions were answered to the satisfaction of the patient.  I spent 45 minutes reviewing the patient's chart and counseling/coordinating the care of this patient  regarding his ventral hernia.   Graciella Freer, DO [facility] [address] [place], [place] [address] [phone] (office)

## 2021-06-08 ENCOUNTER — Encounter: Payer: Self-pay | Admitting: *Deleted

## 2021-06-25 DIAGNOSIS — M170 Bilateral primary osteoarthritis of knee: Secondary | ICD-10-CM | POA: Diagnosis not present

## 2021-06-25 DIAGNOSIS — Z96651 Presence of right artificial knee joint: Secondary | ICD-10-CM | POA: Diagnosis not present

## 2021-07-16 DIAGNOSIS — Z96651 Presence of right artificial knee joint: Secondary | ICD-10-CM | POA: Diagnosis not present

## 2021-07-17 ENCOUNTER — Other Ambulatory Visit: Payer: Self-pay

## 2021-07-17 ENCOUNTER — Ambulatory Visit (HOSPITAL_COMMUNITY)
Admission: RE | Admit: 2021-07-17 | Discharge: 2021-07-17 | Disposition: A | Discharge status: Home / Self Care | Payer: Medicare Other | Source: Ambulatory Visit | Attending: Surgery | Admitting: Surgery

## 2021-07-17 ENCOUNTER — Encounter (HOSPITAL_COMMUNITY): Payer: Self-pay | Admitting: Radiology

## 2021-07-17 DIAGNOSIS — N281 Cyst of kidney, acquired: Secondary | ICD-10-CM | POA: Diagnosis not present

## 2021-07-17 DIAGNOSIS — K8689 Other specified diseases of pancreas: Secondary | ICD-10-CM | POA: Diagnosis not present

## 2021-07-17 DIAGNOSIS — K7689 Other specified diseases of liver: Secondary | ICD-10-CM | POA: Diagnosis not present

## 2021-07-17 DIAGNOSIS — K436 Other and unspecified ventral hernia with obstruction, without gangrene: Secondary | ICD-10-CM | POA: Insufficient documentation

## 2021-07-17 DIAGNOSIS — K439 Ventral hernia without obstruction or gangrene: Secondary | ICD-10-CM | POA: Diagnosis not present

## 2021-07-17 LAB — POCT I-STAT CREATININE: Creatinine, Ser: 1 mg/dL (ref 0.61–1.24)

## 2021-07-17 MED ORDER — IOHEXOL 300 MG/ML  SOLN
100.0000 mL | Freq: Once | INTRAMUSCULAR | Status: AC | PRN
  Administered 2021-07-17: 100 mL via INTRAVENOUS

## 2021-07-24 ENCOUNTER — Encounter: Payer: Self-pay | Admitting: Surgery

## 2021-07-24 ENCOUNTER — Other Ambulatory Visit: Payer: Self-pay

## 2021-07-24 ENCOUNTER — Ambulatory Visit (INDEPENDENT_AMBULATORY_CARE_PROVIDER_SITE_OTHER): Payer: Medicare Other | Admitting: Surgery

## 2021-07-24 VITALS — BP 129/67 | HR 60 | Temp 98.2°F | Resp 14 | Ht 73.0 in | Wt 213.0 lb

## 2021-07-24 DIAGNOSIS — K436 Other and unspecified ventral hernia with obstruction, without gangrene: Secondary | ICD-10-CM | POA: Diagnosis not present

## 2021-07-24 DIAGNOSIS — K439 Ventral hernia without obstruction or gangrene: Secondary | ICD-10-CM

## 2021-07-24 NOTE — Progress Notes (Signed)
[facility] Note  ? ?HPI:  ?71 y.o. Male presents to clinic for follow-up after CT of the abdomen and pelvis was performed to evaluate for his ventral hernias.  I explained that on CT scan, he has 3 different ventral hernias present, but the hernias are only containing fat with no evidence of bowel within the defects.  Patient has been tolerating a diet without nausea and vomiting, and has been moving his bowels without issue.  He intermittently will have pain associated with his hernias when he is up and performing any kind of physical activity. ? ?Review of Systems:  ?All other review of systems: otherwise negative  ? ?Vital Signs:  ?BP 129/67   Pulse 60   Temp 98.2 ?F (36.8 ?C) (Other (Comment))   Resp 14   Ht '6\' 1"'$  (1.854 m)   Wt 213 lb (96.6 kg)   SpO2 93%   BMI 28.10 kg/m?   ? ?Physical Exam:  ?Physical Exam ?Vitals reviewed.  ?Constitutional:   ?   Appearance: Normal appearance.  ?Abdominal:  ?   Comments: Abdomen, soft, nondistended, no percussion tenderness, nontender to palpation; multiple soft reducible ventral hernias noted near umbilicus, and inferior associated with previous midline incision site  ?Neurological:  ?   Mental Status: He is alert.  ? ?Laboratory studies: None ? ?Imaging:  ?CT abdomen and pelvis (07/17/2021): ?IMPRESSION: ?1. There are 4 fat containing ventral hernias including a large ?right-sided fat containing hernia with a 15 mm aperture width and a ?moderate-sized fat containing left-sided ventral hernia with a 2.1 ?cm aperture. ?2. Bilateral renal sinus cysts and hypodense renal lesions too small ?to accurately characterize but statistically likely to reflect ?cysts. ?3. Periampullary duodenal diverticulum. ?4.  Aortic Atherosclerosis (ICD10-I70.0). ? ?Assessment:  ?71 y.o. yo Male who presents status post CT abdomen and pelvis to evaluate multiple ventral hernias. ? ?Plan:  ?-We discussed that while he has numerous ventral hernias, he would be a candidate for  a ventral hernia repair.  I discussed that given the multiple defects, repair would be more difficult if approached from an open approach.  I would plan to perform a laparoscopic ventral hernia repair.  I did discuss that given his prior abdominal surgery, we may have significant issues related to lysis of adhesions, that he will be at increased risk for injury to intra-abdominal structures, and that there is a small chance that we will be unable to repair a hernia repair at that time. ?-Patient is understanding and agreeable to this plan.  He states that he is scheduled to have a left knee replacement next week. ?-Given that he is due to be undergoing a recent orthopedic surgery, recommend that we will evaluate the patient 1 to 2 months after his surgery to verify that he is strong enough to undergo a second surgery at that time. ?-No need to delay his left knee replacement for his ventral hernias, as they are asymptomatic at this time and not containing loops of bowel based on CT findings ?-I reiterated to the patient the need for him to present more urgently if his hernias become hard, tender to palpation, overlying skin erythema, nausea, vomiting, and obstipation ?-Tentative follow-up scheduled for the patient on 5/16 to discuss scheduling ventral hernia repair ? ?All of the above recommendations were discussed with the patient, and all of patient's questions were answered to his expressed satisfaction. ? ?Graciella Freer, DO ?[facility] ?[place], [place] [address] ?[phone] (office) ? ? ?

## 2021-07-26 DIAGNOSIS — M170 Bilateral primary osteoarthritis of knee: Secondary | ICD-10-CM | POA: Diagnosis not present

## 2021-07-27 DIAGNOSIS — Z01818 Encounter for other preprocedural examination: Secondary | ICD-10-CM | POA: Diagnosis not present

## 2021-08-01 DIAGNOSIS — Z9049 Acquired absence of other specified parts of digestive tract: Secondary | ICD-10-CM | POA: Diagnosis not present

## 2021-08-01 DIAGNOSIS — Z9842 Cataract extraction status, left eye: Secondary | ICD-10-CM | POA: Diagnosis not present

## 2021-08-01 DIAGNOSIS — Z20822 Contact with and (suspected) exposure to covid-19: Secondary | ICD-10-CM | POA: Diagnosis not present

## 2021-08-01 DIAGNOSIS — E785 Hyperlipidemia, unspecified: Secondary | ICD-10-CM | POA: Diagnosis not present

## 2021-08-01 DIAGNOSIS — M1712 Unilateral primary osteoarthritis, left knee: Secondary | ICD-10-CM | POA: Diagnosis not present

## 2021-08-01 DIAGNOSIS — K219 Gastro-esophageal reflux disease without esophagitis: Secondary | ICD-10-CM | POA: Diagnosis not present

## 2021-08-01 DIAGNOSIS — Z96652 Presence of left artificial knee joint: Secondary | ICD-10-CM | POA: Diagnosis not present

## 2021-08-01 DIAGNOSIS — Z86718 Personal history of other venous thrombosis and embolism: Secondary | ICD-10-CM | POA: Diagnosis not present

## 2021-08-01 DIAGNOSIS — Z7982 Long term (current) use of aspirin: Secondary | ICD-10-CM | POA: Diagnosis not present

## 2021-08-01 DIAGNOSIS — Z79899 Other long term (current) drug therapy: Secondary | ICD-10-CM | POA: Diagnosis not present

## 2021-08-01 DIAGNOSIS — Z86010 Personal history of colonic polyps: Secondary | ICD-10-CM | POA: Diagnosis not present

## 2021-08-01 DIAGNOSIS — Z87891 Personal history of nicotine dependence: Secondary | ICD-10-CM | POA: Diagnosis not present

## 2021-08-01 DIAGNOSIS — Z91038 Other insect allergy status: Secondary | ICD-10-CM | POA: Diagnosis not present

## 2021-08-01 DIAGNOSIS — Z96651 Presence of right artificial knee joint: Secondary | ICD-10-CM | POA: Diagnosis not present

## 2021-08-01 DIAGNOSIS — Z9841 Cataract extraction status, right eye: Secondary | ICD-10-CM | POA: Diagnosis not present

## 2021-08-06 DIAGNOSIS — Z96652 Presence of left artificial knee joint: Secondary | ICD-10-CM | POA: Diagnosis not present

## 2021-08-06 DIAGNOSIS — R262 Difficulty in walking, not elsewhere classified: Secondary | ICD-10-CM | POA: Diagnosis not present

## 2021-08-06 DIAGNOSIS — M25662 Stiffness of left knee, not elsewhere classified: Secondary | ICD-10-CM | POA: Diagnosis not present

## 2021-08-06 DIAGNOSIS — M170 Bilateral primary osteoarthritis of knee: Secondary | ICD-10-CM | POA: Diagnosis not present

## 2021-08-10 DIAGNOSIS — R262 Difficulty in walking, not elsewhere classified: Secondary | ICD-10-CM | POA: Diagnosis not present

## 2021-08-10 DIAGNOSIS — Z96652 Presence of left artificial knee joint: Secondary | ICD-10-CM | POA: Diagnosis not present

## 2021-08-10 DIAGNOSIS — M25662 Stiffness of left knee, not elsewhere classified: Secondary | ICD-10-CM | POA: Diagnosis not present

## 2021-08-10 DIAGNOSIS — M170 Bilateral primary osteoarthritis of knee: Secondary | ICD-10-CM | POA: Diagnosis not present

## 2021-08-14 DIAGNOSIS — M25662 Stiffness of left knee, not elsewhere classified: Secondary | ICD-10-CM | POA: Diagnosis not present

## 2021-08-14 DIAGNOSIS — Z96652 Presence of left artificial knee joint: Secondary | ICD-10-CM | POA: Diagnosis not present

## 2021-08-14 DIAGNOSIS — R262 Difficulty in walking, not elsewhere classified: Secondary | ICD-10-CM | POA: Diagnosis not present

## 2021-08-14 DIAGNOSIS — M170 Bilateral primary osteoarthritis of knee: Secondary | ICD-10-CM | POA: Diagnosis not present

## 2021-08-16 DIAGNOSIS — M25662 Stiffness of left knee, not elsewhere classified: Secondary | ICD-10-CM | POA: Diagnosis not present

## 2021-08-16 DIAGNOSIS — M170 Bilateral primary osteoarthritis of knee: Secondary | ICD-10-CM | POA: Diagnosis not present

## 2021-08-16 DIAGNOSIS — Z96652 Presence of left artificial knee joint: Secondary | ICD-10-CM | POA: Diagnosis not present

## 2021-08-16 DIAGNOSIS — R262 Difficulty in walking, not elsewhere classified: Secondary | ICD-10-CM | POA: Diagnosis not present

## 2021-08-21 DIAGNOSIS — R262 Difficulty in walking, not elsewhere classified: Secondary | ICD-10-CM | POA: Diagnosis not present

## 2021-08-21 DIAGNOSIS — K439 Ventral hernia without obstruction or gangrene: Secondary | ICD-10-CM | POA: Diagnosis not present

## 2021-08-21 DIAGNOSIS — I10 Essential (primary) hypertension: Secondary | ICD-10-CM | POA: Diagnosis not present

## 2021-08-21 DIAGNOSIS — Z96652 Presence of left artificial knee joint: Secondary | ICD-10-CM | POA: Diagnosis not present

## 2021-08-21 DIAGNOSIS — M25662 Stiffness of left knee, not elsewhere classified: Secondary | ICD-10-CM | POA: Diagnosis not present

## 2021-08-21 DIAGNOSIS — M170 Bilateral primary osteoarthritis of knee: Secondary | ICD-10-CM | POA: Diagnosis not present

## 2021-08-21 DIAGNOSIS — Z6827 Body mass index (BMI) 27.0-27.9, adult: Secondary | ICD-10-CM | POA: Diagnosis not present

## 2021-08-28 DIAGNOSIS — M170 Bilateral primary osteoarthritis of knee: Secondary | ICD-10-CM | POA: Diagnosis not present

## 2021-08-28 DIAGNOSIS — R262 Difficulty in walking, not elsewhere classified: Secondary | ICD-10-CM | POA: Diagnosis not present

## 2021-08-28 DIAGNOSIS — M25662 Stiffness of left knee, not elsewhere classified: Secondary | ICD-10-CM | POA: Diagnosis not present

## 2021-08-28 DIAGNOSIS — Z96652 Presence of left artificial knee joint: Secondary | ICD-10-CM | POA: Diagnosis not present

## 2021-08-30 DIAGNOSIS — M25662 Stiffness of left knee, not elsewhere classified: Secondary | ICD-10-CM | POA: Diagnosis not present

## 2021-08-30 DIAGNOSIS — M170 Bilateral primary osteoarthritis of knee: Secondary | ICD-10-CM | POA: Diagnosis not present

## 2021-08-30 DIAGNOSIS — Z96652 Presence of left artificial knee joint: Secondary | ICD-10-CM | POA: Diagnosis not present

## 2021-08-30 DIAGNOSIS — R262 Difficulty in walking, not elsewhere classified: Secondary | ICD-10-CM | POA: Diagnosis not present

## 2021-09-04 DIAGNOSIS — R262 Difficulty in walking, not elsewhere classified: Secondary | ICD-10-CM | POA: Diagnosis not present

## 2021-09-04 DIAGNOSIS — M170 Bilateral primary osteoarthritis of knee: Secondary | ICD-10-CM | POA: Diagnosis not present

## 2021-09-04 DIAGNOSIS — M25662 Stiffness of left knee, not elsewhere classified: Secondary | ICD-10-CM | POA: Diagnosis not present

## 2021-09-04 DIAGNOSIS — Z96652 Presence of left artificial knee joint: Secondary | ICD-10-CM | POA: Diagnosis not present

## 2021-09-06 DIAGNOSIS — Z96652 Presence of left artificial knee joint: Secondary | ICD-10-CM | POA: Diagnosis not present

## 2021-09-06 DIAGNOSIS — M170 Bilateral primary osteoarthritis of knee: Secondary | ICD-10-CM | POA: Diagnosis not present

## 2021-09-06 DIAGNOSIS — R262 Difficulty in walking, not elsewhere classified: Secondary | ICD-10-CM | POA: Diagnosis not present

## 2021-09-06 DIAGNOSIS — M25662 Stiffness of left knee, not elsewhere classified: Secondary | ICD-10-CM | POA: Diagnosis not present

## 2021-09-10 DIAGNOSIS — Z6827 Body mass index (BMI) 27.0-27.9, adult: Secondary | ICD-10-CM | POA: Diagnosis not present

## 2021-09-10 DIAGNOSIS — M4722 Other spondylosis with radiculopathy, cervical region: Secondary | ICD-10-CM | POA: Diagnosis not present

## 2021-09-11 DIAGNOSIS — M170 Bilateral primary osteoarthritis of knee: Secondary | ICD-10-CM | POA: Diagnosis not present

## 2021-09-11 DIAGNOSIS — R262 Difficulty in walking, not elsewhere classified: Secondary | ICD-10-CM | POA: Diagnosis not present

## 2021-09-11 DIAGNOSIS — M25662 Stiffness of left knee, not elsewhere classified: Secondary | ICD-10-CM | POA: Diagnosis not present

## 2021-09-11 DIAGNOSIS — Z96652 Presence of left artificial knee joint: Secondary | ICD-10-CM | POA: Diagnosis not present

## 2021-09-13 DIAGNOSIS — M170 Bilateral primary osteoarthritis of knee: Secondary | ICD-10-CM | POA: Diagnosis not present

## 2021-09-13 DIAGNOSIS — R262 Difficulty in walking, not elsewhere classified: Secondary | ICD-10-CM | POA: Diagnosis not present

## 2021-09-13 DIAGNOSIS — Z96652 Presence of left artificial knee joint: Secondary | ICD-10-CM | POA: Diagnosis not present

## 2021-09-13 DIAGNOSIS — M25662 Stiffness of left knee, not elsewhere classified: Secondary | ICD-10-CM | POA: Diagnosis not present

## 2021-09-17 DIAGNOSIS — Z96653 Presence of artificial knee joint, bilateral: Secondary | ICD-10-CM | POA: Diagnosis not present

## 2021-09-25 ENCOUNTER — Ambulatory Visit: Payer: Medicare Other | Admitting: Surgery

## 2021-09-27 ENCOUNTER — Encounter: Payer: Self-pay | Admitting: Surgery

## 2021-09-27 ENCOUNTER — Ambulatory Visit (INDEPENDENT_AMBULATORY_CARE_PROVIDER_SITE_OTHER): Payer: Medicare Other | Admitting: Surgery

## 2021-09-27 VITALS — BP 117/69 | HR 63 | Temp 98.1°F | Resp 14 | Ht 73.0 in | Wt 212.0 lb

## 2021-09-27 DIAGNOSIS — K439 Ventral hernia without obstruction or gangrene: Secondary | ICD-10-CM | POA: Diagnosis not present

## 2021-10-01 NOTE — Progress Notes (Signed)
[facility] Note   HPI:  71 y.o. Male presents to clinic for to discuss surgery for his multiple ventral hernias.  Patient underwent a left knee replacement about 6 weeks ago, and he has been doing well since surgery.  He occasionally has pain associated with his hernias, but denies nausea or vomiting and has been moving his bowels without issue.  He denies fevers and chills.    Review of Systems:  All other review of systems: otherwise negative   Vital Signs:  BP 117/69   Pulse 63   Temp 98.1 F (36.7 C) (Oral)   Resp 14   Ht '6\' 1"'$  (1.854 m)   Wt 212 lb (96.2 kg)   SpO2 94%   BMI 27.97 kg/m    Physical Exam:  Physical Exam Vitals reviewed.  Constitutional:      Appearance: Normal appearance.  Abdominal:     Comments: Abdomen soft, nondistended, no percussion tenderness, nontender to palpation; no rigidity, guarding, rebound tenderness; multiple soft reducible ventral hernias noted near umbilicus, and inferior associated with previous midline incision site   Neurological:     Mental Status: He is alert.   Laboratory studies: None   Imaging:  CT abdomen and pelvis (07/17/2021): IMPRESSION: 1. There are 4 fat containing ventral hernias including a large right-sided fat containing hernia with a 15 mm aperture width and a moderate-sized fat containing left-sided ventral hernia with a 2.1 cm aperture. 2. Bilateral renal sinus cysts and hypodense renal lesions too small to accurately characterize but statistically likely to reflect cysts. 3. Periampullary duodenal diverticulum. 4.  Aortic Atherosclerosis (ICD10-I70.0).  Assessment:  71 y.o. yo Male who presents to discuss ventral hernia repair  Plan:  -We again discussed that he will be a candidate for surgery for his multiple ventral hernias.  I again reiterated that I would attempt to start laparoscopically to repair his ventral hernias, as an open repair would be more difficult.  We discussed the risks  and benefits of the procedure, including but not limited to bleeding, infection, injury to surrounding structures, need for additional procedures, possible open repair, or small possibility of not repairing the hernias at the time of surgery.  After careful consideration, Keturah Barre, Brooke Bonito. agreed to proceed with laparoscopic ventral hernia repair with mesh, possible open -Patient has been doing well since his orthopedic surgery -His wife is scheduled to undergo a procedure next week, and he would like to verify that she is adequately healed from this procedure prior to scheduling surgery -Patient tentatively scheduled for surgery on 7/10 -Advised the patient present to the emergency department if he begins to have a hard painful hernia bulge with overlying skin changes, nausea, vomiting, and obstipation  All of the above recommendations were discussed with the patient, and all of patient's questions were answered to his expressed satisfaction.  Graciella Freer, DO [facility] [address] [place], [place] [address] [phone] (office)

## 2021-10-29 DIAGNOSIS — Z96652 Presence of left artificial knee joint: Secondary | ICD-10-CM | POA: Diagnosis not present

## 2021-11-14 ENCOUNTER — Encounter (HOSPITAL_COMMUNITY): Payer: Self-pay

## 2021-11-14 NOTE — Patient Instructions (Signed)
[place]  11/14/2021     '@[email or web address]'$ @   Your procedure is scheduled on  11/19/2021.   Report to [facility] at  0600 A.M.   Call this number if you have problems the morning of surgery:  [phone]   Remember:  Do not eat or drink after midnight.      Take these medicines the morning of surgery with A SIP OF WATER                                  zyrtec, aciphex.    Do not wear jewelry, make-up or nail polish.  Do not wear lotions, powders, or perfumes, or deodorant.  Do not shave 48 hours prior to surgery.  Men may shave face and neck.  Do not bring valuables to the hospital.  [facility] is not responsible for any belongings or valuables.  Contacts, dentures or bridgework may not be worn into surgery.  Leave your suitcase in the car.  After surgery it may be brought to your room.  For patients admitted to the hospital, discharge time will be determined by your treatment team.  Patients discharged the day of surgery will not be allowed to drive home and must have someone with them for 24 hours.    Special instructions:   DO NOT smoke tobacco or vape for 24 hours before your procedure.  Please read over the following fact sheets that you were given. Coughing and Deep Breathing, Surgical Site Infection Prevention, Anesthesia Post-op Instructions, and Care and Recovery After Surgery      Open Hernia Repair, Adult, Care After What can I expect after the procedure? After the procedure, it is common to have: Mild discomfort. Slight bruising. Mild swelling. Pain in the belly (abdomen). A small amount of blood from the cut from surgery (incision). Follow these instructions at home: Your doctor may give you more specific instructions. If you have problems, call your doctor. Medicines Take over-the-counter and prescription medicines only as told by your doctor. If told, take steps to prevent problems with pooping (constipation). You may  need to: Drink enough fluid to keep your pee (urine) pale yellow. Take medicines. You will be told what medicines to take. Eat foods that are high in fiber. These include beans, whole grains, and fresh fruits and vegetables. Limit foods that are high in fat and sugar. These include fried or sweet foods. Ask your doctor if you should avoid driving or using machines while you are taking your medicine. Incision care  Follow instructions from your doctor about how to take care of your incision. Make sure you: Wash your hands with soap and water for at least 20 seconds before and after you change your bandage (dressing). If you cannot use soap and water, use hand sanitizer. Change your bandage. Leave stitches or skin glue in place for at least 2 weeks. Leave tape strips alone unless you are told to take them off. You may trim the edges of the tape strips if they curl up. Check your incision every day for signs of infection. Check for: More redness, swelling, or pain. More fluid or blood. Warmth. Pus or a bad smell. Wear loose, soft clothing while your incision heals. Activity  Rest as told by your doctor. Do not lift anything that is heavier than 10 lb (4.5 kg), or the limit that  you are told. Do not play contact sports until your doctor says that this is safe. If you were given a sedative during your procedure, do not drive or use machines until your doctor says that it is safe. A sedative is a medicine that helps you relax. Return to your normal activities when your doctor says that it is safe. General instructions Do not take baths, swim, or use a hot tub. Ask your doctor about taking showers or sponge baths. Hold a pillow over your belly when you cough or sneeze. This helps with pain. Do not smoke or use any products that contain nicotine or tobacco. If you need help quitting, ask your doctor. Keep all follow-up visits. Contact a doctor if: You have any of these signs of infection in  or around your incision: More redness, swelling, or pain. More fluid or blood. Warmth. Pus. A bad smell. You have a fever or chills. You have blood in your poop (stool). You have not pooped (had a bowel movement) in 2-3 days. Medicine does not help your pain. Get help right away if: You have chest pain, or you are short of breath. You feel faint or light-headed. You have very bad pain. You vomit and your pain is worse. You have pain, swelling, or redness in a leg. These symptoms may be an emergency. Get help right away. Call your local emergency services (911 in the [place] Do not wait to see if the symptoms will go away. Do not drive yourself to the hospital. Summary After this procedure, it is common to have mild discomfort, slight bruising, and mild swelling. Follow instructions from your doctor about how to take care of your cut from surgery (incision). Check every day for signs of infection. Do not lift heavy objects or play contact sports until your doctor says it is safe. Return to your normal activities as told by your doctor. This information is not intended to replace advice given to you by your health care provider. Make sure you discuss any questions you have with your health care provider. Document Revised: 12/13/2019 Document Reviewed: 12/13/2019 Elsevier Patient Education  [place]. Laparoscopic Ventral Hernia Repair, Care After The following information offers guidance on how to care for yourself after your procedure. Your health care provider may also give you more specific instructions. If you have problems or questions, contact your health care provider. What can I expect after the procedure? After the procedure, it is common to have pain, discomfort, or soreness. Follow these instructions at home: Medicines Take over-the-counter and prescription medicines only as told by your health care provider. Ask your health care provider if the medicine prescribed  to you: Requires you to avoid driving or using machinery. Can cause constipation. You may need to take these actions to prevent or treat constipation: Drink enough fluid to keep your urine pale yellow. Take over-the-counter or prescription medicines. Eat foods that are high in fiber, such as beans, whole grains, and fresh fruits and vegetables. Limit foods that are high in fat and processed sugars, such as fried or sweet foods. Incision care  Follow instructions from your health care provider about how to take care of your incisions. Make sure you: Wash your hands with soap and water for at least 20 seconds before and after you change your bandage (dressing) or before you touch your abdomen. If soap and water are not available, use hand sanitizer. Change your dressing as told by your health care provider. Leave stitches (sutures),  skin glue, or adhesive strips in place. These skin closures may need to stay in place for 2 weeks or longer. If adhesive strip edges start to loosen and curl up, you may trim the loose edges. Do not remove adhesive strips completely unless your health care provider tells you to do that. Check your incision areas every day for signs of infection. Check for: More redness, swelling, or pain. Fluid or blood. Warmth. Pus or a bad smell. Bathing  Do not take baths, swim, or use a hot tub until your health care provider approves. Ask your health care provider if you may take showers. You may only be allowed to take sponge baths. Keep your dressing dry until your health care provider says it can be removed. Activity  Rest as told by your health care provider. Avoid sitting for a long time without moving. Get up to take short walks every 1-2 hours. This is important to improve blood flow and breathing. Ask for help if you feel weak or unsteady. Do not lift anything that is heavier than 10 lb (4.5 kg), or the limit that you are told, until your health care provider says  that it is safe. If you were given a sedative during the procedure, it can affect you for several hours. Do not drive or operate machinery until your health care provider says that it is safe. Return to your normal activities as told by your health care provider. Ask your health care provider what activities are safe for you. General instructions  Hold a pillow over your abdomen when you cough or sneeze. This helps with pain. Do not use any products that contain nicotine or tobacco. These products include cigarettes, chewing tobacco, and vaping devices, such as e-cigarettes. These can delay healing after surgery. If you need help quitting, ask your health care provider. You may be asked to continue to do deep breathing exercises at home. This will help to prevent a lung infection. Keep all follow-up visits. This is important. Contact a health care provider if: You have any of these signs of infection: More redness, swelling, or pain around an incision. Fluid or blood coming from an incision. Warmth coming from an incision. Pus or a bad smell coming from an incision. A fever or chills. You have pain that gets worse or does not get better with medicine. You have nausea or vomiting. You have a cough. You have shortness of breath. You have not had a bowel movement in 3 days. You are not able to urinate. Get help right away if you have: Severe pain in your abdomen. Persistent nausea and vomiting. Redness, warmth, or pain in your leg. Chest pain. Trouble breathing. These symptoms may represent a serious problem that is an emergency. Do not wait to see if the symptoms will go away. Get medical help right away. Call your local emergency services (911 in the [place] Do not drive yourself to the hospital. Summary After this procedure, it is common to have pain, discomfort, or soreness. Follow instructions from your health care provider about how to take care of your incision. Check your incision  area every day for signs of infection. Report any signs of infection to your health care provider. Keep all follow-up visits. This is important. This information is not intended to replace advice given to you by your health care provider. Make sure you discuss any questions you have with your health care provider. Document Revised: 12/17/2019 Document Reviewed: 12/17/2019 Elsevier Patient Education  2023  [place] Anesthesia, Adult, Care After This sheet gives you information about how to care for yourself after your procedure. Your health care provider may also give you more specific instructions. If you have problems or questions, contact your health care provider. What can I expect after the procedure? After the procedure, the following side effects are common: Pain or discomfort at the IV site. Nausea. Vomiting. Sore throat. Trouble concentrating. Feeling cold or chills. Feeling weak or tired. Sleepiness and fatigue. Soreness and body aches. These side effects can affect parts of the body that were not involved in surgery. Follow these instructions at home: For the time period you were told by your health care provider:  Rest. Do not participate in activities where you could fall or become injured. Do not drive or use machinery. Do not drink alcohol. Do not take sleeping pills or medicines that cause drowsiness. Do not make important decisions or sign legal documents. Do not take care of children on your own. Eating and drinking Follow any instructions from your health care provider about eating or drinking restrictions. When you feel hungry, start by eating small amounts of foods that are soft and easy to digest (bland), such as toast. Gradually return to your regular diet. Drink enough fluid to keep your urine pale yellow. If you vomit, rehydrate by drinking water, juice, or clear broth. General instructions If you have sleep apnea, surgery and certain medicines  can increase your risk for breathing problems. Follow instructions from your health care provider about wearing your sleep device: Anytime you are sleeping, including during daytime naps. While taking prescription pain medicines, sleeping medicines, or medicines that make you drowsy. Have a responsible adult stay with you for the time you are told. It is important to have someone help care for you until you are awake and alert. Return to your normal activities as told by your health care provider. Ask your health care provider what activities are safe for you. Take over-the-counter and prescription medicines only as told by your health care provider. If you smoke, do not smoke without supervision. Keep all follow-up visits as told by your health care provider. This is important. Contact a health care provider if: You have nausea or vomiting that does not get better with medicine. You cannot eat or drink without vomiting. You have pain that does not get better with medicine. You are unable to pass urine. You develop a skin rash. You have a fever. You have redness around your IV site that gets worse. Get help right away if: You have difficulty breathing. You have chest pain. You have blood in your urine or stool, or you vomit blood. Summary After the procedure, it is common to have a sore throat or nausea. It is also common to feel tired. Have a responsible adult stay with you for the time you are told. It is important to have someone help care for you until you are awake and alert. When you feel hungry, start by eating small amounts of foods that are soft and easy to digest (bland), such as toast. Gradually return to your regular diet. Drink enough fluid to keep your urine pale yellow. Return to your normal activities as told by your health care provider. Ask your health care provider what activities are safe for you. This information is not intended to replace advice given to you by your  health care provider. Make sure you discuss any questions you have with your health care provider. Document  Revised: 01/13/2020 Document Reviewed: 08/12/2019 Elsevier Patient Education  [place]. How to Use Chlorhexidine for Bathing Chlorhexidine gluconate (CHG) is a germ-killing (antiseptic) solution that is used to clean the skin. It can get rid of the bacteria that normally live on the skin and can keep them away for about 24 hours. To clean your skin with CHG, you may be given: A CHG solution to use in the shower or as part of a sponge bath. A prepackaged cloth that contains CHG. Cleaning your skin with CHG may help lower the risk for infection: While you are staying in the intensive care unit of the hospital. If you have a vascular access, such as a central line, to provide short-term or long-term access to your veins. If you have a catheter to drain urine from your bladder. If you are on a ventilator. A ventilator is a machine that helps you breathe by moving air in and out of your lungs. After surgery. What are the risks? Risks of using CHG include: A skin reaction. Hearing loss, if CHG gets in your ears and you have a perforated eardrum. Eye injury, if CHG gets in your eyes and is not rinsed out. The CHG product catching fire. Make sure that you avoid smoking and flames after applying CHG to your skin. Do not use CHG: If you have a chlorhexidine allergy or have previously reacted to chlorhexidine. On babies younger than 30 months of age. How to use CHG solution Use CHG only as told by your health care provider, and follow the instructions on the label. Use the full amount of CHG as directed. Usually, this is one bottle. During a shower Follow these steps when using CHG solution during a shower (unless your health care provider gives you different instructions): Start the shower. Use your normal soap and shampoo to wash your face and hair. Turn off the shower or move out  of the shower stream. Pour the CHG onto a clean washcloth. Do not use any type of brush or rough-edged sponge. Starting at your neck, lather your body down to your toes. Make sure you follow these instructions: If you will be having surgery, pay special attention to the part of your body where you will be having surgery. Scrub this area for at least 1 minute. Do not use CHG on your head or face. If the solution gets into your ears or eyes, rinse them well with water. Avoid your genital area. Avoid any areas of skin that have broken skin, cuts, or scrapes. Scrub your back and under your arms. Make sure to wash skin folds. Let the lather sit on your skin for 1-2 minutes or as long as told by your health care provider. Thoroughly rinse your entire body in the shower. Make sure that all body creases and crevices are rinsed well. Dry off with a clean towel. Do not put any substances on your body afterward--such as powder, lotion, or perfume--unless you are told to do so by your health care provider. Only use lotions that are recommended by the manufacturer. Put on clean clothes or pajamas. If it is the night before your surgery, sleep in clean sheets.  During a sponge bath Follow these steps when using CHG solution during a sponge bath (unless your health care provider gives you different instructions): Use your normal soap and shampoo to wash your face and hair. Pour the CHG onto a clean washcloth. Starting at your neck, lather your body down to your toes.  Make sure you follow these instructions: If you will be having surgery, pay special attention to the part of your body where you will be having surgery. Scrub this area for at least 1 minute. Do not use CHG on your head or face. If the solution gets into your ears or eyes, rinse them well with water. Avoid your genital area. Avoid any areas of skin that have broken skin, cuts, or scrapes. Scrub your back and under your arms. Make sure to wash  skin folds. Let the lather sit on your skin for 1-2 minutes or as long as told by your health care provider. Using a different clean, wet washcloth, thoroughly rinse your entire body. Make sure that all body creases and crevices are rinsed well. Dry off with a clean towel. Do not put any substances on your body afterward--such as powder, lotion, or perfume--unless you are told to do so by your health care provider. Only use lotions that are recommended by the manufacturer. Put on clean clothes or pajamas. If it is the night before your surgery, sleep in clean sheets. How to use CHG prepackaged cloths Only use CHG cloths as told by your health care provider, and follow the instructions on the label. Use the CHG cloth on clean, dry skin. Do not use the CHG cloth on your head or face unless your health care provider tells you to. When washing with the CHG cloth: Avoid your genital area. Avoid any areas of skin that have broken skin, cuts, or scrapes. Before surgery Follow these steps when using a CHG cloth to clean before surgery (unless your health care provider gives you different instructions): Using the CHG cloth, vigorously scrub the part of your body where you will be having surgery. Scrub using a back-and-forth motion for 3 minutes. The area on your body should be completely wet with CHG when you are done scrubbing. Do not rinse. Discard the cloth and let the area air-dry. Do not put any substances on the area afterward, such as powder, lotion, or perfume. Put on clean clothes or pajamas. If it is the night before your surgery, sleep in clean sheets.  For general bathing Follow these steps when using CHG cloths for general bathing (unless your health care provider gives you different instructions). Use a separate CHG cloth for each area of your body. Make sure you wash between any folds of skin and between your fingers and toes. Wash your body in the following order, switching to a new  cloth after each step: The front of your neck, shoulders, and chest. Both of your arms, under your arms, and your hands. Your stomach and groin area, avoiding the genitals. Your right leg and foot. Your left leg and foot. The back of your neck, your back, and your buttocks. Do not rinse. Discard the cloth and let the area air-dry. Do not put any substances on your body afterward--such as powder, lotion, or perfume--unless you are told to do so by your health care provider. Only use lotions that are recommended by the manufacturer. Put on clean clothes or pajamas. Contact a health care provider if: Your skin gets irritated after scrubbing. You have questions about using your solution or cloth. You swallow any chlorhexidine. Call your local poison control center (1-334-461-2678 in the [place] Get help right away if: Your eyes itch badly, or they become very red or swollen. Your skin itches badly and is red or swollen. Your hearing changes. You have trouble seeing. You have  swelling or tingling in your mouth or throat. You have trouble breathing. These symptoms may represent a serious problem that is an emergency. Do not wait to see if the symptoms will go away. Get medical help right away. Call your local emergency services (911 in the [place] Do not drive yourself to the hospital. Summary Chlorhexidine gluconate (CHG) is a germ-killing (antiseptic) solution that is used to clean the skin. Cleaning your skin with CHG may help to lower your risk for infection. You may be given CHG to use for bathing. It may be in a bottle or in a prepackaged cloth to use on your skin. Carefully follow your health care provider's instructions and the instructions on the product label. Do not use CHG if you have a chlorhexidine allergy. Contact your health care provider if your skin gets irritated after scrubbing. This information is not intended to replace advice given to you by your health care provider. Make  sure you discuss any questions you have with your health care provider. Document Revised: 07/10/2020 Document Reviewed: 07/10/2020 Elsevier Patient Education  [place].

## 2021-11-15 ENCOUNTER — Encounter (HOSPITAL_COMMUNITY)
Admission: RE | Admit: 2021-11-15 | Discharge: 2021-11-15 | Disposition: A | Discharge status: Home / Self Care | Payer: Medicare Other | Source: Ambulatory Visit | Attending: Surgery | Admitting: Surgery

## 2021-11-19 ENCOUNTER — Ambulatory Visit (HOSPITAL_COMMUNITY)
Admission: RE | Admit: 2021-11-19 | Discharge: 2021-11-19 | Disposition: A | Discharge status: Home / Self Care | Payer: Medicare Other | Attending: Surgery | Admitting: Surgery

## 2021-11-19 ENCOUNTER — Other Ambulatory Visit: Payer: Self-pay

## 2021-11-19 ENCOUNTER — Ambulatory Visit (HOSPITAL_COMMUNITY): Payer: Medicare Other | Admitting: Anesthesiology

## 2021-11-19 ENCOUNTER — Ambulatory Visit (HOSPITAL_BASED_OUTPATIENT_CLINIC_OR_DEPARTMENT_OTHER): Payer: Medicare Other | Admitting: Anesthesiology

## 2021-11-19 ENCOUNTER — Encounter (HOSPITAL_COMMUNITY): Payer: Self-pay | Admitting: Surgery

## 2021-11-19 ENCOUNTER — Encounter (HOSPITAL_COMMUNITY)
Admission: RE | Disposition: A | Discharge status: Home / Self Care | Payer: Self-pay | Source: Home / Self Care | Attending: Surgery

## 2021-11-19 DIAGNOSIS — K219 Gastro-esophageal reflux disease without esophagitis: Secondary | ICD-10-CM | POA: Insufficient documentation

## 2021-11-19 DIAGNOSIS — K432 Incisional hernia without obstruction or gangrene: Secondary | ICD-10-CM | POA: Insufficient documentation

## 2021-11-19 DIAGNOSIS — Z9049 Acquired absence of other specified parts of digestive tract: Secondary | ICD-10-CM | POA: Insufficient documentation

## 2021-11-19 DIAGNOSIS — K439 Ventral hernia without obstruction or gangrene: Secondary | ICD-10-CM

## 2021-11-19 HISTORY — PX: VENTRAL HERNIA REPAIR: SHX424

## 2021-11-19 SURGERY — REPAIR, HERNIA, VENTRAL, LAPAROSCOPIC
Anesthesia: General | Site: Abdomen

## 2021-11-19 MED ORDER — CHLORHEXIDINE GLUCONATE CLOTH 2 % EX PADS
6.0000 | MEDICATED_PAD | Freq: Once | CUTANEOUS | Status: AC
  Administered 2021-11-19: 6 via TOPICAL

## 2021-11-19 MED ORDER — SUGAMMADEX SODIUM 200 MG/2ML IV SOLN
INTRAVENOUS | Status: DC | PRN
  Administered 2021-11-19: 200 mg via INTRAVENOUS

## 2021-11-19 MED ORDER — FENTANYL CITRATE (PF) 100 MCG/2ML IJ SOLN
INTRAMUSCULAR | Status: DC | PRN
  Administered 2021-11-19: 100 ug via INTRAVENOUS
  Administered 2021-11-19 (×3): 50 ug via INTRAVENOUS

## 2021-11-19 MED ORDER — LIDOCAINE HCL (CARDIAC) PF 100 MG/5ML IV SOSY
PREFILLED_SYRINGE | INTRAVENOUS | Status: DC | PRN
  Administered 2021-11-19: 80 mg via INTRAVENOUS

## 2021-11-19 MED ORDER — ORAL CARE MOUTH RINSE: 15.0000 mL | Freq: Once | OROMUCOSAL | Status: AC

## 2021-11-19 MED ORDER — DOCUSATE SODIUM 100 MG PO CAPS: 100.0000 mg | ORAL_CAPSULE | Freq: Two times a day (BID) | ORAL | 2 refills | Status: DC

## 2021-11-19 MED ORDER — ONDANSETRON HCL 4 MG/2ML IJ SOLN
INTRAMUSCULAR | Status: AC
  Filled 2021-11-19: qty 2

## 2021-11-19 MED ORDER — LACTATED RINGERS IV SOLN: INTRAVENOUS | Status: DC

## 2021-11-19 MED ORDER — ONDANSETRON HCL 4 MG/2ML IJ SOLN
INTRAMUSCULAR | Status: DC | PRN
  Administered 2021-11-19: 4 mg via INTRAVENOUS

## 2021-11-19 MED ORDER — LIDOCAINE HCL (PF) 2 % IJ SOLN
INTRAMUSCULAR | Status: AC
  Filled 2021-11-19: qty 5

## 2021-11-19 MED ORDER — EPHEDRINE SULFATE (PRESSORS) 50 MG/ML IJ SOLN
INTRAMUSCULAR | Status: DC | PRN
  Administered 2021-11-19: 5 mg via INTRAVENOUS

## 2021-11-19 MED ORDER — CEFAZOLIN SODIUM-DEXTROSE 2-4 GM/100ML-% IV SOLN
2.0000 g | INTRAVENOUS | Status: AC
  Administered 2021-11-19: 2 g via INTRAVENOUS

## 2021-11-19 MED ORDER — FENTANYL CITRATE (PF) 250 MCG/5ML IJ SOLN
INTRAMUSCULAR | Status: AC
  Filled 2021-11-19: qty 5

## 2021-11-19 MED ORDER — BUPIVACAINE LIPOSOME 1.3 % IJ SUSP
INTRAMUSCULAR | Status: AC
  Filled 2021-11-19: qty 20

## 2021-11-19 MED ORDER — ACETAMINOPHEN 500 MG PO TABS: 1000.0000 mg | ORAL_TABLET | Freq: Four times a day (QID) | ORAL | 0 refills | Status: AC

## 2021-11-19 MED ORDER — OXYCODONE HCL 5 MG PO TABS: 5.0000 mg | ORAL_TABLET | Freq: Four times a day (QID) | ORAL | 0 refills | Status: AC | PRN

## 2021-11-19 MED ORDER — 0.9 % SODIUM CHLORIDE (POUR BTL) OPTIME
TOPICAL | Status: DC | PRN
  Administered 2021-11-19: 1000 mL

## 2021-11-19 MED ORDER — DEXAMETHASONE SODIUM PHOSPHATE 10 MG/ML IJ SOLN
INTRAMUSCULAR | Status: DC | PRN
  Administered 2021-11-19: 10 mg via INTRAVENOUS

## 2021-11-19 MED ORDER — PROPOFOL 10 MG/ML IV BOLUS
INTRAVENOUS | Status: AC
  Filled 2021-11-19: qty 20

## 2021-11-19 MED ORDER — DEXAMETHASONE SODIUM PHOSPHATE 10 MG/ML IJ SOLN
INTRAMUSCULAR | Status: AC
  Filled 2021-11-19: qty 1

## 2021-11-19 MED ORDER — CEFAZOLIN SODIUM-DEXTROSE 2-4 GM/100ML-% IV SOLN
INTRAVENOUS | Status: AC
  Filled 2021-11-19: qty 100

## 2021-11-19 MED ORDER — HYDROMORPHONE HCL 1 MG/ML IJ SOLN
INTRAMUSCULAR | Status: AC
  Filled 2021-11-19: qty 1

## 2021-11-19 MED ORDER — BUPIVACAINE LIPOSOME 1.3 % IJ SUSP
INTRAMUSCULAR | Status: DC | PRN
  Administered 2021-11-19: 20 mL

## 2021-11-19 MED ORDER — PHENYLEPHRINE 80 MCG/ML (10ML) SYRINGE FOR IV PUSH (FOR BLOOD PRESSURE SUPPORT)
PREFILLED_SYRINGE | INTRAVENOUS | Status: AC
  Filled 2021-11-19: qty 30

## 2021-11-19 MED ORDER — ROCURONIUM BROMIDE 100 MG/10ML IV SOLN
INTRAVENOUS | Status: DC | PRN
  Administered 2021-11-19: 30 mg via INTRAVENOUS
  Administered 2021-11-19: 50 mg via INTRAVENOUS

## 2021-11-19 MED ORDER — ASPIRIN EC 81 MG PO TBEC: 81.0000 mg | DELAYED_RELEASE_TABLET | Freq: Every day | ORAL | 11 refills | Status: AC

## 2021-11-19 MED ORDER — HYDROMORPHONE HCL 1 MG/ML IJ SOLN
0.2500 mg | INTRAMUSCULAR | Status: DC | PRN
  Administered 2021-11-19 (×2): 0.5 mg via INTRAVENOUS

## 2021-11-19 MED ORDER — CHLORHEXIDINE GLUCONATE CLOTH 2 % EX PADS: 6.0000 | MEDICATED_PAD | Freq: Once | CUTANEOUS | Status: DC

## 2021-11-19 MED ORDER — EPHEDRINE 5 MG/ML INJ
INTRAVENOUS | Status: AC
  Filled 2021-11-19: qty 5

## 2021-11-19 MED ORDER — PROPOFOL 10 MG/ML IV BOLUS
INTRAVENOUS | Status: DC | PRN
  Administered 2021-11-19: 150 mg via INTRAVENOUS

## 2021-11-19 MED ORDER — ONDANSETRON HCL 4 MG/2ML IJ SOLN: 4.0000 mg | Freq: Once | INTRAMUSCULAR | Status: DC | PRN

## 2021-11-19 MED ORDER — ROCURONIUM BROMIDE 10 MG/ML (PF) SYRINGE
PREFILLED_SYRINGE | INTRAVENOUS | Status: AC
  Filled 2021-11-19: qty 10

## 2021-11-19 MED ORDER — CHLORHEXIDINE GLUCONATE 0.12 % MT SOLN
15.0000 mL | Freq: Once | OROMUCOSAL | Status: AC
  Administered 2021-11-19: 15 mL via OROMUCOSAL

## 2021-11-19 SURGICAL SUPPLY — 51 items
ADH SKN CLS APL DERMABOND .7 (GAUZE/BANDAGES/DRESSINGS) ×2
BINDER ABDOMINAL 12 ML 46-62 (SOFTGOODS) ×1 IMPLANT
BLADE SURG 15 STRL LF DISP TIS (BLADE) ×2 IMPLANT
BLADE SURG 15 STRL SS (BLADE) ×2
CLOTH BEACON ORANGE TIMEOUT ST (SAFETY) ×3 IMPLANT
COVER LIGHT HANDLE STERIS (MISCELLANEOUS) ×6 IMPLANT
DERMABOND ADVANCED (GAUZE/BANDAGES/DRESSINGS) ×2
DERMABOND ADVANCED .7 DNX12 (GAUZE/BANDAGES/DRESSINGS) ×2 IMPLANT
ELECT REM PT RETURN 9FT ADLT (ELECTROSURGICAL) ×2
ELECTRODE REM PT RTRN 9FT ADLT (ELECTROSURGICAL) ×2 IMPLANT
GAUZE SPONGE 4X4 12PLY STRL (GAUZE/BANDAGES/DRESSINGS) ×1 IMPLANT
GLOVE BIO SURGEON STRL SZ 6.5 (GLOVE) ×1 IMPLANT
GLOVE BIOGEL PI IND STRL 6.5 (GLOVE) ×2 IMPLANT
GLOVE BIOGEL PI IND STRL 7.0 (GLOVE) ×4 IMPLANT
GLOVE BIOGEL PI INDICATOR 6.5 (GLOVE) ×3
GLOVE BIOGEL PI INDICATOR 7.0 (GLOVE) ×2
GLOVE SURG SS PI 6.5 STRL IVOR (GLOVE) ×7 IMPLANT
GLOVE SURG SS PI 7.5 STRL IVOR (GLOVE) ×1 IMPLANT
GOWN STRL REUS W/TWL LRG LVL3 (GOWN DISPOSABLE) ×9 IMPLANT
GRASPER SUT TROCAR 14GX15 (MISCELLANEOUS) ×3 IMPLANT
INST SET LAPROSCOPIC AP (KITS) ×3 IMPLANT
KIT TURNOVER KIT A (KITS) ×3 IMPLANT
LIGASURE LAP ATLAS 10MM 37CM (INSTRUMENTS) ×3 IMPLANT
MANIFOLD NEPTUNE II (INSTRUMENTS) ×3 IMPLANT
MESH VENTRALIGHT ST 8X10 (Mesh General) ×1 IMPLANT
NDL HYPO 21X1.5 SAFETY (NEEDLE) ×2 IMPLANT
NDL INSUFFLATION 14GA 120MM (NEEDLE) ×2 IMPLANT
NEEDLE HYPO 21X1.5 SAFETY (NEEDLE) ×2 IMPLANT
NEEDLE INSUFFLATION 14GA 120MM (NEEDLE) ×2 IMPLANT
NS IRRIG 1000ML POUR BTL (IV SOLUTION) ×3 IMPLANT
PACK LAP CHOLE LZT030E (CUSTOM PROCEDURE TRAY) ×3 IMPLANT
PAD ARMBOARD 7.5X6 YLW CONV (MISCELLANEOUS) ×3 IMPLANT
PENCIL SMOKE EVACUATOR COATED (MISCELLANEOUS) ×3 IMPLANT
SET BASIN LINEN APH (SET/KITS/TRAYS/PACK) ×3 IMPLANT
SET TUBE SMOKE EVAC HIGH FLOW (TUBING) ×3 IMPLANT
SOL PREP POV-IOD 4OZ 10% (MISCELLANEOUS) ×1 IMPLANT
SOL SCRUB PVP POV-IOD 4OZ 7.5% (MISCELLANEOUS) ×2
SOLUTION SCRB POV-IOD 4OZ 7.5% (MISCELLANEOUS) IMPLANT
SUT MNCRL AB 4-0 PS2 18 (SUTURE) ×6 IMPLANT
SUT PROLENE 2 0 CR (SUTURE) ×3 IMPLANT
SUT VICRYL 0 UR6 27IN ABS (SUTURE) ×7 IMPLANT
SYR 20ML LL LF (SYRINGE) ×6 IMPLANT
SYS BAG RETRIEVAL 10MM (BASKET) ×2
SYSTEM BAG RETRIEVAL 10MM (BASKET) IMPLANT
TACKER 5MM HERNIA 3.5CML NAB (ENDOMECHANICALS) ×3 IMPLANT
TRAY FOLEY MTR SLVR 16FR STAT (SET/KITS/TRAYS/PACK) ×3 IMPLANT
TROCAR Z-THRD FIOS HNDL 11X100 (TROCAR) ×3 IMPLANT
TROCAR Z-THREAD FIOS 5X100MM (TROCAR) ×3 IMPLANT
TROCAR Z-THREAD SLEEVE 11X100 (TROCAR) ×7 IMPLANT
WARMER LAPAROSCOPE (MISCELLANEOUS) ×3 IMPLANT
YANKAUER SUCT BULB TIP 10FT TU (MISCELLANEOUS) ×3 IMPLANT

## 2021-11-19 NOTE — Discharge Instructions (Signed)
Ambulatory Surgery Discharge Instructions  General Anesthesia or Sedation Do not drive or operate heavy machinery for 24 hours.  Do not consume alcohol, tranquilizers, sleeping medications, or any non-prescribed medications for 24 hours. Do not make important decisions or sign any important papers in the next 24 hours. You should have someone with you tonight at home.  Activity  You are advised to go directly home from the hospital.  Restrict your activities and rest for a day.  Resume light activity tomorrow. No heavy lifting over 10 lbs or strenuous exercise for 4 weeks. Wear your abdominal binder at all times except while sleeping and bathing for the first week after surgery. You may then wear the abdominal binder as needed for comfort.  Fluids and Diet Begin with clear liquids, bouillon, dry toast, soda crackers.  If not nauseated, you may go to a regular diet when you desire.  Greasy and spicy foods are not advised.  Medications  If you have not had a bowel movement in 24 hours, take 2 tablespoons over the counter Milk of mag.             You May resume your blood thinners tomorrow (Aspirin, coumadin, or other).  You are being discharged with prescriptions for Opioid/Narcotic Medications: There are some specific considerations for these medications that you should know. Opioid Meds have risks & benefits. Addiction to these meds is always a concern with prolonged use Take medication only as directed Do not drive while taking narcotic pain medication Do not crush tablets or capsules Do not use a different container than medication was dispensed in Lock the container of medication in a cool, dry place out of reach of children and pets. Opioid medication can cause addiction Do not share with anyone else (this is a felony) Do not store medications for future use. Dispose of them properly.     Disposal:  Find a [employer] household drug take back site near you.  If you can't get to a  drug take back site, use the recipe below as a last resort to dispose of expired, unused or unwanted drugs. Disposal  (Do not dispose chemotherapy drugs this way, talk to your prescribing doctor instead.) Step 1: Mix drugs (do not crush) with dirt, kitty litter, or used coffee grounds and add a small amount of water to dissolve any solid medications. Step 2: Seal drugs in plastic bag. Step 3: Place plastic bag in trash. Step 4: Take prescription container and scratch out personal information, then recycle or throw away.  Operative Site  You have a liquid bandage over your incisions, this will begin to flake off in about a week. Ok to [employer] developer. Keep wound clean and dry. No baths or swimming. No lifting more than 10 pounds.  Contact Information: If you have questions or concerns, please call our office, [phone], Monday- Thursday 8AM-5PM and Friday 8AM-12Noon.  If it is after hours or on the weekend, please call [facility] Main Number, [phone], and ask to speak to the surgeon on call for Dr. Okey Dupre at [facility].   SPECIFIC COMPLICATIONS TO WATCH FOR: Inability to urinate Fever over 101? F by mouth Nausea and vomiting lasting longer than 24 hours. Pain not relieved by medication ordered Swelling around the operative site Increased redness, warmth, hardness, around operative area Numbness, tingling, or cold fingers or toes Blood -soaked dressing, (small amounts of oozing may be normal) Increasing and progressive drainage from surgical area or exam site

## 2021-11-19 NOTE — Progress Notes (Signed)
Patient had a light pink birthmark on/slightly below his sternum  After patient was clipped, petechiae was noted on the lower abdomen but there was no active bleeding.

## 2021-11-19 NOTE — Anesthesia Preprocedure Evaluation (Signed)
Anesthesia Evaluation  Patient identified by MRN, date of birth, ID band Patient awake    Reviewed: Allergy & Precautions, NPO status , Patient's Chart, lab work & pertinent test results  Airway Mallampati: II  TM Distance: >3 FB Neck ROM: Full    Dental  (+) Dental Advisory Given, Caps   Pulmonary neg pulmonary ROS,    Pulmonary exam normal breath sounds clear to auscultation       Cardiovascular negative cardio ROS Normal cardiovascular exam Rhythm:Regular Rate:Normal     Neuro/Psych negative neurological ROS  negative psych ROS   GI/Hepatic Neg liver ROS, GERD  Medicated and Controlled,  Endo/Other  negative endocrine ROS  Renal/GU negative Renal ROS  negative genitourinary   Musculoskeletal  (+) Arthritis , Osteoarthritis,    Abdominal   Peds negative pediatric ROS (+)  Hematology negative hematology ROS (+)   Anesthesia Other Findings   Reproductive/Obstetrics negative OB ROS                             Anesthesia Physical Anesthesia Plan  ASA: 2  Anesthesia Plan: General   Post-op Pain Management: Dilaudid IV   Induction: Intravenous  PONV Risk Score and Plan: 4 or greater and Ondansetron and Dexamethasone  Airway Management Planned: Oral ETT  Additional Equipment:   Intra-op Plan:   Post-operative Plan: Extubation in OR  Informed Consent: I have reviewed the patients History and Physical, chart, labs and discussed the procedure including the risks, benefits and alternatives for the proposed anesthesia with the patient or authorized representative who has indicated his/her understanding and acceptance.     Dental advisory given  Plan Discussed with: CRNA and Surgeon  Anesthesia Plan Comments:         Anesthesia Quick Evaluation

## 2021-11-19 NOTE — Transfer of Care (Signed)
Immediate Anesthesia Transfer of Care Note  Patient: Harold Payne.  Procedure(s) Performed: LAPAROSCOPIC VENTRAL HERNIA REPAIR WITH MESH (Abdomen)  Patient Location: PACU  Anesthesia Type:General  Level of Consciousness: awake, alert  and oriented  Airway & Oxygen Therapy: Patient connected to nasal cannula oxygen  Post-op Assessment: Report given to RN and Post -op Vital signs reviewed and stable  Post vital signs: Reviewed and stable  Last Vitals:  Vitals Value Taken Time  BP 171/96 11/19/21 1000  Temp    Pulse 77 11/19/21 1003  Resp 20 11/19/21 1003  SpO2 94 % 11/19/21 1003  Vitals shown include unvalidated device data.  Last Pain:  Vitals:   11/19/21 0629  TempSrc: Oral  PainSc: 0-No pain         Complications: No notable events documented.

## 2021-11-19 NOTE — Op Note (Signed)
[facility] Operative Note  11/19/21  Preoperative Diagnosis: Ventral hernia x 4   Postoperative Diagnosis: Ventral hernia x 5   Procedure(s) Performed: Laparoscopic ventral hernia repair with mesh   Surgeon: Graciella Freer, DO    Assistants: Dr. Aviva Signs   Anesthesia: General endotracheal   Anesthesiologist: Dr. Charna Elizabeth   Specimens: None   Estimated Blood Loss: Minimal (20 cc)   Blood Replacement: None    Complications: None   Wound Class: Clean   Operative Indications: Patient is a 71 year old male who presents for laparoscopic ventral hernia repair.  He has multiple ventral hernias from previous surgical incisions, that he would like repaired at this time secondary to pain.  All risks and benefits of performing this procedure were discussed with the patient including pain, infection, bleeding, damage to surrounding structures, and need for more procedures or surgery. The patient voiced understanding of the procedure. All questions were sought and answered, and consent was obtained.  Findings: Ventral hernia lateral to midline (right side), 3 x 3 cm; Ventral hernia lateral to midline (left side), 3 x 2 cm; Midline ventral hernia, 2 x 2 cm; Two additional defects at superior aspect of midline incision, one 2 x 1 cm and the other 1 x 1 cm   Procedure: The patient was taken to the operating room and placed supine. General endotracheal anesthesia was induced. Intravenous antibiotics were administered per protocol.  An orogastric tube positioned to decompress the stomach and foley was placed to decompress the bladder. The abdomen was prepared and draped in the usual sterile fashion.   An incision was made at Palmer's point and a Veress technique was utilized to achieve pneumoperitoneum to 15 mmHg with carbon dioxide. Intraperitoneal placement was confirmed with saline drop, low entry pressures, and easy insufflation.  A 11 mm optiview port was placed  through the supraumbilical region, and a 10 mm 0-degree operative laparoscope was introduced. The area underlying the trocar and Veress needle were inspected and without evidence of injury.    Upon inspection of the abdomen, the omentum was noted to be adhesed to the anterior abdominal wall overlying the ventral hernias. Subsequently the following ports were inserted under direct visualization in the typical fashion: aa 11 mm right lower quadrant port and a 11 mm port in the left lower quadrant.   Omental attachments to the anterior abdominal wall were lysed in the usual fashion using LigaSure. All adhesions were taken down until the hernia defects were clearly identified. Total area was approximately 11 x 9 cm (combination of 5 ventral hernias: right lateral hernia- 3 x 3 cm, left lateral hernia- 3 x 2 cm, midline hernia- 2 x 2 cm, superior midline- 2 x 1 cm, and superior midline- 1 x 1 cm).  An appropriately sized Ventralight ST mesh was selected and measured to fit on the anterior abdominal wall.  The mesh measured 20.3 cm x 25.4 cm.  Areas for retention sutures were marked on the abdomen.  2-0 Prolene stay sutures were then placed in the 12, 3, 6 and 9 o'clock positions on the mesh. It was rolled so they could be placed within the 11 mm port. The mesh was easily passed into the abdominal cavity under direct visualization without difficulty using an atraumatic grasper.  Mesh was then unrolled and was then appropriately oriented and secured along the periphery via the retention sutures.  The edges of the mesh were then tacked down with Protacker. The mesh was noted to lay  nicely along the anterior abdominal wall. Additional tacks were then placed in order to complete the double crown technique.  Final inspection revealed acceptable hemostasis.   All ports were then removed under direct visualization. The abdomen was allowed to desufflate and the mesh was noted to lay flat on top of the bowel/omentum. The  fascia of the port sites were reapproximated using 0 Vucryl suture in a figure-of-eight fashion. All incisions were closed with 4-0 Vicryl sutures. The incisions and retention suture sites were dressed with Dermabond. Abdominal binder was placed.  Dr. Arnoldo Morale was assisting throughout the procedure and was present for the critical portions of the case, including lysis of adhesions and mesh placement/tacking.   All counts were correct at the end of the case. The patient was awakened from anesthesia and extubated without complication.  The patient went to the PACU in stable condition.   Graciella Freer, DO  [facility] [address] [place], [place] [address] [phone] (office)

## 2021-11-19 NOTE — Anesthesia Postprocedure Evaluation (Signed)
Anesthesia Post Note  Patient: Harold Payne.  Procedure(s) Performed: LAPAROSCOPIC VENTRAL HERNIA REPAIR WITH MESH (Abdomen)  Patient location during evaluation: PACU Anesthesia Type: General Level of consciousness: awake and alert and oriented Pain management: pain level controlled Vital Signs Assessment: post-procedure vital signs reviewed and stable Respiratory status: spontaneous breathing, nonlabored ventilation and respiratory function stable Cardiovascular status: blood pressure returned to baseline and stable Postop Assessment: no apparent nausea or vomiting Anesthetic complications: no   No notable events documented.   Last Vitals:  Vitals:   11/19/21 0648 11/19/21 1000  BP: (!) 148/85 (!) 171/96  Pulse:  80  Resp:  17  Temp:    SpO2:  95%    Last Pain:  Vitals:   11/19/21 0629  TempSrc: Oral  PainSc: 0-No pain                 Maysen Sudol C Grason Brailsford

## 2021-11-19 NOTE — Addendum Note (Signed)
Addendum  created 11/19/21 1125 by Corlis Hove, RN   Flowsheet accepted

## 2021-11-19 NOTE — Anesthesia Procedure Notes (Deleted)
Procedure Name: Intubation Date/Time: 11/19/2021 7:40 AM  Performed by: Denese Killings, MDPre-anesthesia Checklist: Patient identified, Emergency Drugs available, Suction available and Patient being monitored Patient Re-evaluated:Patient Re-evaluated prior to induction Oxygen Delivery Method: Circle system utilized Preoxygenation: Pre-oxygenation with 100% oxygen Induction Type: IV induction Ventilation: Mask ventilation without difficulty Laryngoscope Size: Mac and 4 Tube type: Oral Number of attempts: 1 Airway Equipment and Method: Stylet and Oral airway Placement Confirmation: ETT inserted through vocal cords under direct vision, positive ETCO2 and breath sounds checked- equal and bilateral Secured at: 22 cm Tube secured with: Tape Dental Injury: Teeth and Oropharynx as per pre-operative assessment

## 2021-11-19 NOTE — H&P (Signed)
Harold Payne. is a 71 y.o. male.  HPI: Patient presents for evaluation of his right incisional ventral hernia.  He underwent colectomy in the past for a cancerous polyp noted on colonoscopy.  He subsequently developed an incisional hernia at his surgical site.  He has previously been seen for the hernia, but was told given its size, it was at low risk for incarceration.  Recently, within the last 2 weeks, he has had increased pain associated with his hernia, which led to him following up for evaluation.  He denies nausea, vomiting, and obstipation.  He is passing flatus and having regular bowel movements.  He denies trying to reduce the hernia, but states that it is normally soft.  His other surgical history besides his colectomy include cholecystectomy and multiple joint replacements.  He denies use of blood thinning medications.  He denies smoking, occasionally will drink alcohol, and denies illicit drug use.  Past Medical History:  Diagnosis Date   Arthritis     Past Surgical History:  Procedure Laterality Date   CHOLECYSTECTOMY     COLON SURGERY     COLONOSCOPY     COLONOSCOPY N/A 07/20/2013   Procedure: COLONOSCOPY;  Surgeon: Jamesetta So, MD;  Location: [facility];  Service: Gastroenterology;  Laterality: N/A;   COLONOSCOPY N/A 02/16/2019   Procedure: COLONOSCOPY;  Surgeon: Aviva Signs, MD;  Location: [facility];  Service: Gastroenterology;  Laterality: N/A;  7:30am   ELBOW BURSA SURGERY Left    HYDROCELE EXCISION     KNEE ARTHROSCOPY W/ DEBRIDEMENT      History reviewed. No pertinent family history.  Social History   Tobacco Use   Smoking status: Never   Smokeless tobacco: Never  Vaping Use   Vaping Use: Never used  Substance Use Topics   Alcohol use: Yes    Comment: once a week   Drug use: No    Medications: I have reviewed the patient's current medications. Allergies as of 11/19/2021        Reactions   [employer], Shortness Of Breath             ROS:  Constitutional: negative for chills, fatigue, and fevers Respiratory: negative for cough, wheezing, and shortness of breath Cardiovascular: negative for chest pain and palpitations Gastrointestinal: positive for abdominal pain associated with incisional hernia, negative for constipation, diarrhea, nausea, and vomiting Integument/breast: negative for dryness and rash Musculoskeletal:positive for joint pain, negative for back pain and neck pain  Blood pressure (!) 148/85, pulse (!) 58, temperature 97.9 F (36.6 C), temperature source Oral, resp. rate 12, height '6\' 1"'$  (1.854 m), weight 96.2 kg, SpO2 96 %. Physical Exam Vitals reviewed.  Constitutional:      Appearance: Normal appearance.  HENT:     Head: Normocephalic and atraumatic.  Eyes:     Extraocular Movements: Extraocular movements intact.     Pupils: Pupils are equal, round, and reactive to light.  Cardiovascular:     Rate and Rhythm: Normal rate.  Pulmonary:     Effort: Pulmonary effort is normal.  Abdominal:     Comments: Soft, nondistended, no percussion tenderness, no tenderness to palpation, excluding right-sided ventral/incisional hernia; 2 palpable fascial defects associated with right-sided scar, both hernias soft and reducible, though the larger more inferior 1 did result with some tenderness during reduction; no rigidity, guarding, rebound tenderness   Musculoskeletal:        General: Normal range  of motion.     Cervical back: Normal range of motion.  Skin:    General: Skin is warm and dry.  Neurological:     General: No focal deficit present.     Mental Status: He is alert and oriented to person, place, and time.  Psychiatric:        Mood and Affect: Mood normal.        Behavior: Behavior normal.     Results: CT abdomen and pelvis (07/17/2021): IMPRESSION: 1. There are 4 fat containing ventral hernias including a large right-sided  fat containing hernia with a 15 mm aperture width and a moderate-sized fat containing left-sided ventral hernia with a 2.1 cm aperture. 2. Bilateral renal sinus cysts and hypodense renal lesions too small to accurately characterize but statistically likely to reflect cysts. 3. Periampullary duodenal diverticulum. 4.  Aortic Atherosclerosis (ICD10-I70.0).   Assessment & Plan:  Nnaemeka Samson. is a 71 y.o. male with ventral hernia.   -We again discussed that he will be a candidate for surgery for his multiple ventral hernias.  I again reiterated that I would attempt to start laparoscopically to repair his ventral hernias, as an open repair would be more difficult.  We discussed the risks and benefits of the procedure, including but not limited to bleeding, infection, injury to surrounding structures, need for additional procedures, possible open repair, or small possibility of not repairing the hernias at the time of surgery.  After careful consideration, Keturah Barre, Brooke Bonito. agreed to proceed with laparoscopic ventral hernia repair with mesh, possible open -Patient has been doing well since his orthopedic surgery -His wife is scheduled to undergo a procedure next week, and he would like to verify that she is adequately healed from this procedure prior to scheduling surgery -Patient tentatively scheduled for surgery on 7/10 -Advised the patient present to the emergency department if he begins to have a hard painful hernia bulge with overlying skin changes, nausea, vomiting, and obstipation   All of the above recommendations were discussed with the patient, and all of patient's questions were answered to his expressed satisfaction.   Graciella Freer, DO [facility] [address] [place], [place] [address] [phone] (office)

## 2021-11-19 NOTE — Progress Notes (Signed)
Update Note:  I spoke with the patient's wife, Harold Payne, in the consultation room.  I explained that he tolerated the procedure very well.  I was able to perform the repair laparoscopically, and he will be able to be discharged home today.  He did have some adhesions to his abdominal wall, but they were easily able to be taken down without issue.  He has an underlying mesh that is covering all of his hernia defects, and was tacked to his anterior abdominal wall.  He cannot lift more than 10 pounds for the next 4 weeks.  He has an abdominal binder in place, which she should wear at all times except while bathing and sleeping for the next week.  He has dissolvable stitches under the skin and Dermabond overlying his incision sites.  The Dermabond will flake off in 10 to 14 days.  He will follow-up with me in 2 weeks.  I have discharged him with a narcotic pain medication.  If he takes the narcotic pain medication, he should be taking a stool softener.  He should also take scheduled Tylenol for the next week.  All questions were answered to her expressed satisfaction.  Graciella Freer, DO [facility] [address] [place], [place] [address] [phone] (office)

## 2021-11-20 ENCOUNTER — Encounter (HOSPITAL_COMMUNITY): Payer: Self-pay | Admitting: Surgery

## 2021-11-30 ENCOUNTER — Encounter: Payer: Medicare Other | Admitting: General Surgery

## 2021-12-06 ENCOUNTER — Encounter: Payer: Self-pay | Admitting: Surgery

## 2021-12-06 ENCOUNTER — Ambulatory Visit (INDEPENDENT_AMBULATORY_CARE_PROVIDER_SITE_OTHER): Payer: Medicare Other | Admitting: Surgery

## 2021-12-06 VITALS — BP 143/83 | HR 68 | Temp 98.3°F | Resp 18 | Ht 73.0 in | Wt 203.0 lb

## 2021-12-06 DIAGNOSIS — K2100 Gastro-esophageal reflux disease with esophagitis, without bleeding: Secondary | ICD-10-CM | POA: Diagnosis not present

## 2021-12-06 DIAGNOSIS — R7301 Impaired fasting glucose: Secondary | ICD-10-CM | POA: Diagnosis not present

## 2021-12-06 DIAGNOSIS — E7849 Other hyperlipidemia: Secondary | ICD-10-CM | POA: Diagnosis not present

## 2021-12-06 DIAGNOSIS — Z09 Encounter for follow-up examination after completed treatment for conditions other than malignant neoplasm: Secondary | ICD-10-CM

## 2021-12-06 DIAGNOSIS — E7801 Familial hypercholesterolemia: Secondary | ICD-10-CM | POA: Diagnosis not present

## 2021-12-06 NOTE — Progress Notes (Signed)
[facility] Note   HPI:  71 y.o. Male presents to clinic for post-op follow-up status post laparoscopic ventral hernia repair with mesh on 7/10.  He states that he has been doing well postoperatively.  He did not require significant amounts of pain medication.  He now denies any pain.  He is tolerating a diet without nausea and vomiting, moving his bowels without issue.  Denies fevers and chills.  Review of Systems:  All other review of systems: otherwise negative   Vital Signs:  BP (!) 143/83   Pulse 68   Temp 98.3 F (36.8 C) (Oral)   Resp 18   Ht '6\' 1"'$  (1.854 m)   Wt 203 lb (92.1 kg)   SpO2 94%   BMI 26.78 kg/m    Physical Exam:  Physical Exam Vitals reviewed.  Constitutional:      Appearance: Normal appearance.  Abdominal:     Comments: Abdomen soft, nondistended, no percussion tenderness, nontender to palpation; no rigidity, guarding, rebound tenderness; incisions healing well with minimal skin glue still present; location of previous hernias with minimal swelling  Neurological:     Mental Status: He is alert.    Laboratory studies: None  Imaging:  None  Assessment:  71 y.o. yo Male who presents for follow-up status post laparoscopic ventral hernia repair with mesh on 7/10  Plan:  -Patient has been doing very well postoperatively-tolerating a diet without nausea and vomiting, moving his bowels, and adequate pain control -Advised him that the swelling at the site of his previous hernias will continue to resolve with time -Advised him that he still has weight restrictions of no more than 10 pounds for the next 2 to 3 weeks.  He can use his riding lawnmower, but should hold off on using the push mower until his weight restrictions have been lifted.  I also advised him to start slow with lifting once he is 4 to 5 weeks postop -Follow up as needed  All of the above recommendations were discussed with the patient, and all of patient's questions were  answered to his expressed satisfaction.  Graciella Freer, DO [facility] [address] [place], [place] [address] [phone] (office)

## 2021-12-10 DIAGNOSIS — H43813 Vitreous degeneration, bilateral: Secondary | ICD-10-CM | POA: Diagnosis not present

## 2021-12-12 DIAGNOSIS — R030 Elevated blood-pressure reading, without diagnosis of hypertension: Secondary | ICD-10-CM | POA: Diagnosis not present

## 2021-12-12 DIAGNOSIS — E7849 Other hyperlipidemia: Secondary | ICD-10-CM | POA: Diagnosis not present

## 2021-12-12 DIAGNOSIS — Z6827 Body mass index (BMI) 27.0-27.9, adult: Secondary | ICD-10-CM | POA: Diagnosis not present

## 2021-12-12 DIAGNOSIS — L570 Actinic keratosis: Secondary | ICD-10-CM | POA: Diagnosis not present

## 2021-12-12 DIAGNOSIS — L309 Dermatitis, unspecified: Secondary | ICD-10-CM | POA: Diagnosis not present

## 2021-12-12 DIAGNOSIS — E119 Type 2 diabetes mellitus without complications: Secondary | ICD-10-CM | POA: Diagnosis not present

## 2021-12-12 DIAGNOSIS — L821 Other seborrheic keratosis: Secondary | ICD-10-CM | POA: Diagnosis not present

## 2021-12-12 DIAGNOSIS — D485 Neoplasm of uncertain behavior of skin: Secondary | ICD-10-CM | POA: Diagnosis not present

## 2021-12-12 DIAGNOSIS — E875 Hyperkalemia: Secondary | ICD-10-CM | POA: Diagnosis not present

## 2021-12-12 DIAGNOSIS — K219 Gastro-esophageal reflux disease without esophagitis: Secondary | ICD-10-CM | POA: Diagnosis not present

## 2021-12-14 DIAGNOSIS — Z96653 Presence of artificial knee joint, bilateral: Secondary | ICD-10-CM | POA: Diagnosis not present

## 2021-12-14 DIAGNOSIS — M2342 Loose body in knee, left knee: Secondary | ICD-10-CM | POA: Diagnosis not present

## 2021-12-14 DIAGNOSIS — M67861 Other specified disorders of synovium, right knee: Secondary | ICD-10-CM | POA: Diagnosis not present

## 2021-12-14 DIAGNOSIS — M67862 Other specified disorders of synovium, left knee: Secondary | ICD-10-CM | POA: Diagnosis not present

## 2021-12-14 DIAGNOSIS — M25562 Pain in left knee: Secondary | ICD-10-CM | POA: Diagnosis not present

## 2021-12-14 DIAGNOSIS — M7122 Synovial cyst of popliteal space [Baker], left knee: Secondary | ICD-10-CM | POA: Diagnosis not present

## 2021-12-14 DIAGNOSIS — M25461 Effusion, right knee: Secondary | ICD-10-CM | POA: Diagnosis not present

## 2021-12-14 DIAGNOSIS — M25561 Pain in right knee: Secondary | ICD-10-CM | POA: Diagnosis not present

## 2021-12-14 DIAGNOSIS — M25462 Effusion, left knee: Secondary | ICD-10-CM | POA: Diagnosis not present

## 2021-12-14 DIAGNOSIS — M67864 Other specified disorders of tendon, left knee: Secondary | ICD-10-CM | POA: Diagnosis not present

## 2021-12-28 DIAGNOSIS — Z96652 Presence of left artificial knee joint: Secondary | ICD-10-CM | POA: Diagnosis not present

## 2021-12-28 DIAGNOSIS — Z96653 Presence of artificial knee joint, bilateral: Secondary | ICD-10-CM | POA: Diagnosis not present

## 2022-01-30 DIAGNOSIS — D485 Neoplasm of uncertain behavior of skin: Secondary | ICD-10-CM | POA: Diagnosis not present

## 2022-01-30 DIAGNOSIS — L905 Scar conditions and fibrosis of skin: Secondary | ICD-10-CM | POA: Diagnosis not present

## 2022-01-30 DIAGNOSIS — Z23 Encounter for immunization: Secondary | ICD-10-CM | POA: Diagnosis not present

## 2022-01-30 DIAGNOSIS — L570 Actinic keratosis: Secondary | ICD-10-CM | POA: Diagnosis not present

## 2022-01-30 DIAGNOSIS — L858 Other specified epidermal thickening: Secondary | ICD-10-CM | POA: Diagnosis not present

## 2022-02-14 DIAGNOSIS — Z23 Encounter for immunization: Secondary | ICD-10-CM | POA: Diagnosis not present

## 2022-03-11 DIAGNOSIS — Z96653 Presence of artificial knee joint, bilateral: Secondary | ICD-10-CM | POA: Diagnosis not present

## 2022-03-11 DIAGNOSIS — M1710 Unilateral primary osteoarthritis, unspecified knee: Secondary | ICD-10-CM | POA: Diagnosis not present

## 2022-03-13 DIAGNOSIS — M1710 Unilateral primary osteoarthritis, unspecified knee: Secondary | ICD-10-CM | POA: Diagnosis not present

## 2022-03-13 DIAGNOSIS — M25561 Pain in right knee: Secondary | ICD-10-CM | POA: Diagnosis not present

## 2022-03-13 DIAGNOSIS — M25562 Pain in left knee: Secondary | ICD-10-CM | POA: Diagnosis not present

## 2022-03-13 DIAGNOSIS — R29898 Other symptoms and signs involving the musculoskeletal system: Secondary | ICD-10-CM | POA: Diagnosis not present

## 2022-03-13 DIAGNOSIS — Z96652 Presence of left artificial knee joint: Secondary | ICD-10-CM | POA: Diagnosis not present

## 2022-03-13 DIAGNOSIS — R2681 Unsteadiness on feet: Secondary | ICD-10-CM | POA: Diagnosis not present

## 2022-04-16 DIAGNOSIS — R059 Cough, unspecified: Secondary | ICD-10-CM | POA: Diagnosis not present

## 2022-04-16 DIAGNOSIS — J019 Acute sinusitis, unspecified: Secondary | ICD-10-CM | POA: Diagnosis not present

## 2022-04-16 DIAGNOSIS — R509 Fever, unspecified: Secondary | ICD-10-CM | POA: Diagnosis not present

## 2022-04-16 DIAGNOSIS — R030 Elevated blood-pressure reading, without diagnosis of hypertension: Secondary | ICD-10-CM | POA: Diagnosis not present

## 2022-04-16 DIAGNOSIS — J209 Acute bronchitis, unspecified: Secondary | ICD-10-CM | POA: Diagnosis not present

## 2022-04-16 DIAGNOSIS — Z20828 Contact with and (suspected) exposure to other viral communicable diseases: Secondary | ICD-10-CM | POA: Diagnosis not present

## 2022-05-08 DIAGNOSIS — M75102 Unspecified rotator cuff tear or rupture of left shoulder, not specified as traumatic: Secondary | ICD-10-CM | POA: Diagnosis not present

## 2022-05-08 DIAGNOSIS — M25512 Pain in left shoulder: Secondary | ICD-10-CM | POA: Diagnosis not present

## 2022-05-08 DIAGNOSIS — M12812 Other specific arthropathies, not elsewhere classified, left shoulder: Secondary | ICD-10-CM | POA: Diagnosis not present

## 2022-05-28 DIAGNOSIS — R7301 Impaired fasting glucose: Secondary | ICD-10-CM | POA: Diagnosis not present

## 2022-05-28 DIAGNOSIS — E875 Hyperkalemia: Secondary | ICD-10-CM | POA: Diagnosis not present

## 2022-05-28 DIAGNOSIS — E119 Type 2 diabetes mellitus without complications: Secondary | ICD-10-CM | POA: Diagnosis not present

## 2022-05-28 DIAGNOSIS — E7849 Other hyperlipidemia: Secondary | ICD-10-CM | POA: Diagnosis not present

## 2022-05-28 DIAGNOSIS — K2100 Gastro-esophageal reflux disease with esophagitis, without bleeding: Secondary | ICD-10-CM | POA: Diagnosis not present

## 2022-05-28 DIAGNOSIS — E7801 Familial hypercholesterolemia: Secondary | ICD-10-CM | POA: Diagnosis not present

## 2022-05-31 DIAGNOSIS — E875 Hyperkalemia: Secondary | ICD-10-CM | POA: Diagnosis not present

## 2022-05-31 DIAGNOSIS — E119 Type 2 diabetes mellitus without complications: Secondary | ICD-10-CM | POA: Diagnosis not present

## 2022-06-04 DIAGNOSIS — Z6828 Body mass index (BMI) 28.0-28.9, adult: Secondary | ICD-10-CM | POA: Diagnosis not present

## 2022-06-04 DIAGNOSIS — L821 Other seborrheic keratosis: Secondary | ICD-10-CM | POA: Diagnosis not present

## 2022-06-04 DIAGNOSIS — E875 Hyperkalemia: Secondary | ICD-10-CM | POA: Diagnosis not present

## 2022-06-04 DIAGNOSIS — L570 Actinic keratosis: Secondary | ICD-10-CM | POA: Diagnosis not present

## 2022-06-04 DIAGNOSIS — D485 Neoplasm of uncertain behavior of skin: Secondary | ICD-10-CM | POA: Diagnosis not present

## 2022-06-04 DIAGNOSIS — E7849 Other hyperlipidemia: Secondary | ICD-10-CM | POA: Diagnosis not present

## 2022-06-04 DIAGNOSIS — R030 Elevated blood-pressure reading, without diagnosis of hypertension: Secondary | ICD-10-CM | POA: Diagnosis not present

## 2022-06-04 DIAGNOSIS — L309 Dermatitis, unspecified: Secondary | ICD-10-CM | POA: Diagnosis not present

## 2022-06-04 DIAGNOSIS — K219 Gastro-esophageal reflux disease without esophagitis: Secondary | ICD-10-CM | POA: Diagnosis not present

## 2022-06-04 DIAGNOSIS — E119 Type 2 diabetes mellitus without complications: Secondary | ICD-10-CM | POA: Diagnosis not present

## 2022-06-04 DIAGNOSIS — Z23 Encounter for immunization: Secondary | ICD-10-CM | POA: Diagnosis not present

## 2022-07-05 DIAGNOSIS — M25511 Pain in right shoulder: Secondary | ICD-10-CM | POA: Diagnosis not present

## 2022-07-05 DIAGNOSIS — M12811 Other specific arthropathies, not elsewhere classified, right shoulder: Secondary | ICD-10-CM | POA: Diagnosis not present

## 2022-07-05 DIAGNOSIS — M75101 Unspecified rotator cuff tear or rupture of right shoulder, not specified as traumatic: Secondary | ICD-10-CM | POA: Diagnosis not present

## 2022-08-13 DIAGNOSIS — Z85820 Personal history of malignant melanoma of skin: Secondary | ICD-10-CM | POA: Diagnosis not present

## 2022-08-13 DIAGNOSIS — D239 Other benign neoplasm of skin, unspecified: Secondary | ICD-10-CM | POA: Diagnosis not present

## 2022-08-13 DIAGNOSIS — L570 Actinic keratosis: Secondary | ICD-10-CM | POA: Diagnosis not present

## 2022-08-13 DIAGNOSIS — Z1283 Encounter for screening for malignant neoplasm of skin: Secondary | ICD-10-CM | POA: Diagnosis not present

## 2022-08-13 DIAGNOSIS — L280 Lichen simplex chronicus: Secondary | ICD-10-CM | POA: Diagnosis not present

## 2022-09-24 DIAGNOSIS — L0390 Cellulitis, unspecified: Secondary | ICD-10-CM | POA: Diagnosis not present

## 2022-09-24 DIAGNOSIS — W57XXXA Bitten or stung by nonvenomous insect and other nonvenomous arthropods, initial encounter: Secondary | ICD-10-CM | POA: Diagnosis not present

## 2022-09-24 DIAGNOSIS — S50861A Insect bite (nonvenomous) of right forearm, initial encounter: Secondary | ICD-10-CM | POA: Diagnosis not present

## 2022-09-24 DIAGNOSIS — Y939 Activity, unspecified: Secondary | ICD-10-CM | POA: Diagnosis not present

## 2022-11-26 DIAGNOSIS — K219 Gastro-esophageal reflux disease without esophagitis: Secondary | ICD-10-CM | POA: Diagnosis not present

## 2022-11-26 DIAGNOSIS — E875 Hyperkalemia: Secondary | ICD-10-CM | POA: Diagnosis not present

## 2022-11-26 DIAGNOSIS — E7849 Other hyperlipidemia: Secondary | ICD-10-CM | POA: Diagnosis not present

## 2022-11-26 DIAGNOSIS — E119 Type 2 diabetes mellitus without complications: Secondary | ICD-10-CM | POA: Diagnosis not present

## 2022-11-26 DIAGNOSIS — E7801 Familial hypercholesterolemia: Secondary | ICD-10-CM | POA: Diagnosis not present

## 2022-11-26 DIAGNOSIS — R5383 Other fatigue: Secondary | ICD-10-CM | POA: Diagnosis not present

## 2022-12-10 DIAGNOSIS — L570 Actinic keratosis: Secondary | ICD-10-CM | POA: Diagnosis not present

## 2022-12-10 DIAGNOSIS — R030 Elevated blood-pressure reading, without diagnosis of hypertension: Secondary | ICD-10-CM | POA: Diagnosis not present

## 2022-12-10 DIAGNOSIS — L821 Other seborrheic keratosis: Secondary | ICD-10-CM | POA: Diagnosis not present

## 2022-12-10 DIAGNOSIS — Z1331 Encounter for screening for depression: Secondary | ICD-10-CM | POA: Diagnosis not present

## 2022-12-10 DIAGNOSIS — D485 Neoplasm of uncertain behavior of skin: Secondary | ICD-10-CM | POA: Diagnosis not present

## 2022-12-10 DIAGNOSIS — K219 Gastro-esophageal reflux disease without esophagitis: Secondary | ICD-10-CM | POA: Diagnosis not present

## 2022-12-10 DIAGNOSIS — Z6829 Body mass index (BMI) 29.0-29.9, adult: Secondary | ICD-10-CM | POA: Diagnosis not present

## 2022-12-10 DIAGNOSIS — E119 Type 2 diabetes mellitus without complications: Secondary | ICD-10-CM | POA: Diagnosis not present

## 2022-12-10 DIAGNOSIS — E7849 Other hyperlipidemia: Secondary | ICD-10-CM | POA: Diagnosis not present

## 2022-12-10 DIAGNOSIS — L309 Dermatitis, unspecified: Secondary | ICD-10-CM | POA: Diagnosis not present

## 2022-12-10 DIAGNOSIS — Z1389 Encounter for screening for other disorder: Secondary | ICD-10-CM | POA: Diagnosis not present

## 2022-12-10 DIAGNOSIS — E782 Mixed hyperlipidemia: Secondary | ICD-10-CM | POA: Diagnosis not present

## 2022-12-10 DIAGNOSIS — E875 Hyperkalemia: Secondary | ICD-10-CM | POA: Diagnosis not present

## 2023-01-17 DIAGNOSIS — H43813 Vitreous degeneration, bilateral: Secondary | ICD-10-CM | POA: Diagnosis not present

## 2023-01-22 DIAGNOSIS — M75102 Unspecified rotator cuff tear or rupture of left shoulder, not specified as traumatic: Secondary | ICD-10-CM | POA: Diagnosis not present

## 2023-01-22 DIAGNOSIS — M75101 Unspecified rotator cuff tear or rupture of right shoulder, not specified as traumatic: Secondary | ICD-10-CM | POA: Diagnosis not present

## 2023-01-22 DIAGNOSIS — Z23 Encounter for immunization: Secondary | ICD-10-CM | POA: Diagnosis not present

## 2023-01-22 DIAGNOSIS — M12811 Other specific arthropathies, not elsewhere classified, right shoulder: Secondary | ICD-10-CM | POA: Diagnosis not present

## 2023-01-22 DIAGNOSIS — M12812 Other specific arthropathies, not elsewhere classified, left shoulder: Secondary | ICD-10-CM | POA: Diagnosis not present

## 2023-02-12 DIAGNOSIS — Z85820 Personal history of malignant melanoma of skin: Secondary | ICD-10-CM | POA: Diagnosis not present

## 2023-02-12 DIAGNOSIS — D485 Neoplasm of uncertain behavior of skin: Secondary | ICD-10-CM | POA: Diagnosis not present

## 2023-02-12 DIAGNOSIS — L280 Lichen simplex chronicus: Secondary | ICD-10-CM | POA: Diagnosis not present

## 2023-02-12 DIAGNOSIS — L570 Actinic keratosis: Secondary | ICD-10-CM | POA: Diagnosis not present

## 2023-03-24 DIAGNOSIS — Z96653 Presence of artificial knee joint, bilateral: Secondary | ICD-10-CM | POA: Diagnosis not present

## 2023-04-24 DIAGNOSIS — M12812 Other specific arthropathies, not elsewhere classified, left shoulder: Secondary | ICD-10-CM | POA: Diagnosis not present

## 2023-04-24 DIAGNOSIS — M75102 Unspecified rotator cuff tear or rupture of left shoulder, not specified as traumatic: Secondary | ICD-10-CM | POA: Diagnosis not present

## 2023-06-06 DIAGNOSIS — R7301 Impaired fasting glucose: Secondary | ICD-10-CM | POA: Diagnosis not present

## 2023-06-06 DIAGNOSIS — R5383 Other fatigue: Secondary | ICD-10-CM | POA: Diagnosis not present

## 2023-06-06 DIAGNOSIS — E875 Hyperkalemia: Secondary | ICD-10-CM | POA: Diagnosis not present

## 2023-06-06 DIAGNOSIS — E782 Mixed hyperlipidemia: Secondary | ICD-10-CM | POA: Diagnosis not present

## 2023-06-11 DIAGNOSIS — N433 Hydrocele, unspecified: Secondary | ICD-10-CM | POA: Diagnosis not present

## 2023-06-11 DIAGNOSIS — Z6830 Body mass index (BMI) 30.0-30.9, adult: Secondary | ICD-10-CM | POA: Diagnosis not present

## 2023-06-11 DIAGNOSIS — K219 Gastro-esophageal reflux disease without esophagitis: Secondary | ICD-10-CM | POA: Diagnosis not present

## 2023-06-11 DIAGNOSIS — E782 Mixed hyperlipidemia: Secondary | ICD-10-CM | POA: Diagnosis not present

## 2023-06-11 DIAGNOSIS — E1165 Type 2 diabetes mellitus with hyperglycemia: Secondary | ICD-10-CM | POA: Diagnosis not present

## 2023-07-24 DIAGNOSIS — M75101 Unspecified rotator cuff tear or rupture of right shoulder, not specified as traumatic: Secondary | ICD-10-CM | POA: Diagnosis not present

## 2023-07-24 DIAGNOSIS — M75102 Unspecified rotator cuff tear or rupture of left shoulder, not specified as traumatic: Secondary | ICD-10-CM | POA: Diagnosis not present

## 2023-07-24 DIAGNOSIS — M12811 Other specific arthropathies, not elsewhere classified, right shoulder: Secondary | ICD-10-CM | POA: Diagnosis not present

## 2023-07-24 DIAGNOSIS — M12812 Other specific arthropathies, not elsewhere classified, left shoulder: Secondary | ICD-10-CM | POA: Diagnosis not present

## 2023-08-06 IMAGING — CT CT ABD-PELV W/ CM
2 of 5 series · 15 of 46 positions shown, 17 images · IV contrast (Omnipaque or Isovue)
Comparison: March 20, 2005

CLINICAL DATA: Evaluation of right incisional ventral hernia, which
have been present for years but increased of the last 3 months

EXAM:
CT ABDOMEN AND PELVIS WITH CONTRAST
TECHNIQUE: Multidetector CT imaging of the abdomen and pelvis was performed
using the standard protocol following bolus administration of
intravenous contrast.

[Series 2: axial st · axial · 0.92mm/px · z∈[+774,+1284]mm · 12 of 116 slices shown, 14 images]
[im 7/116  soft-tissue]
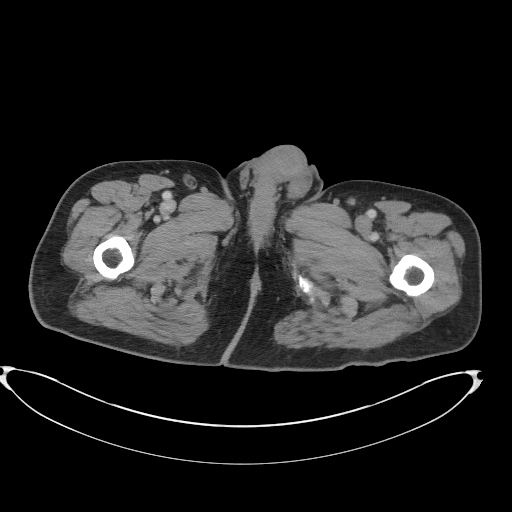
[im 7/116  bone]
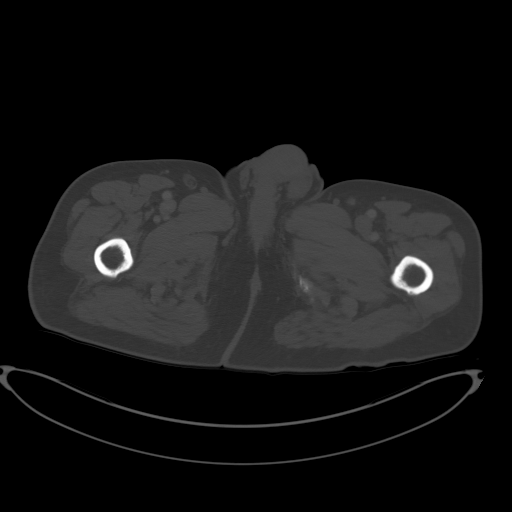
[im 20/116  soft-tissue]
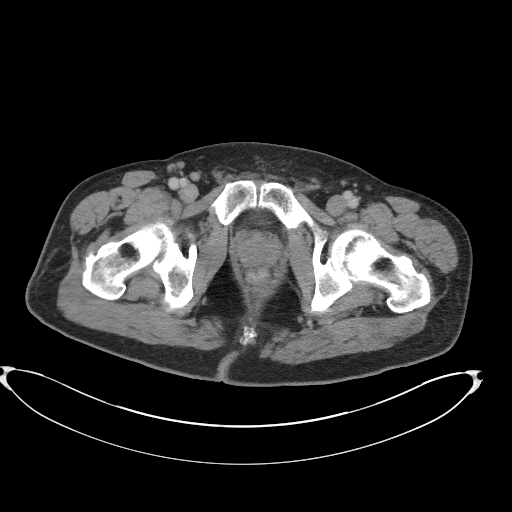
[im 26/116  soft-tissue]
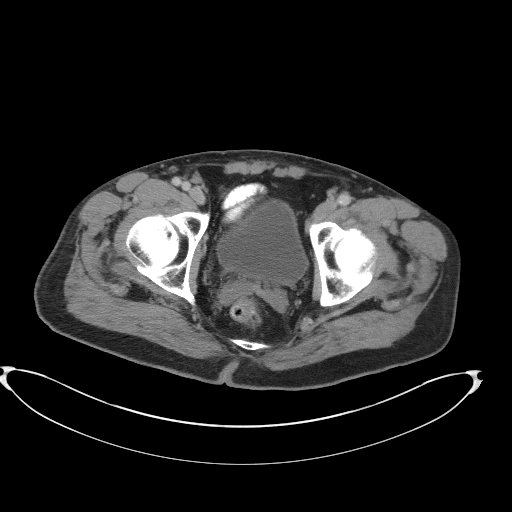
[im 32/116  soft-tissue]
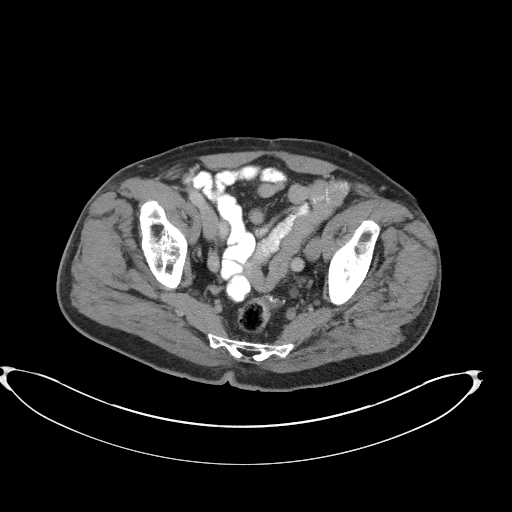
[im 45/116  soft-tissue]
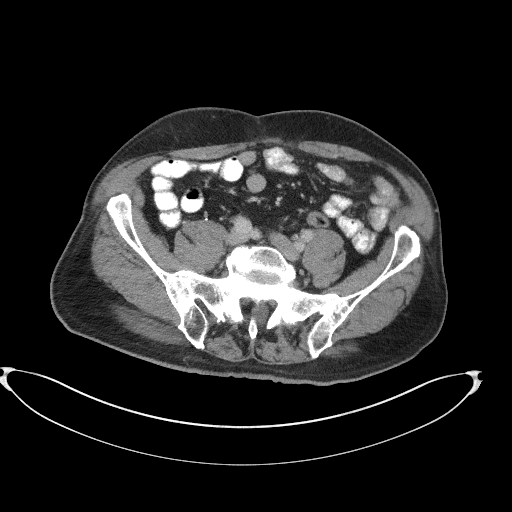
[im 52/116  soft-tissue]
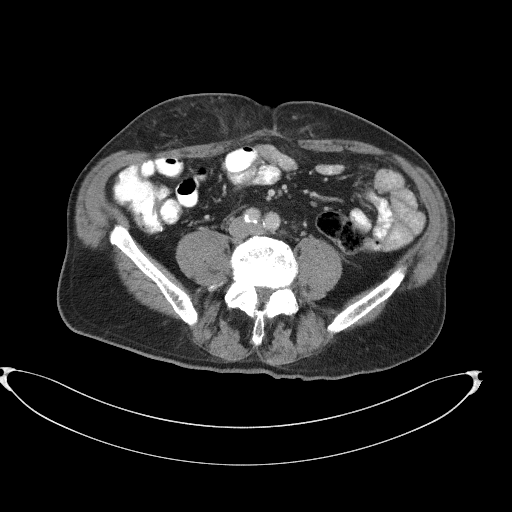
[im 64/116  soft-tissue]
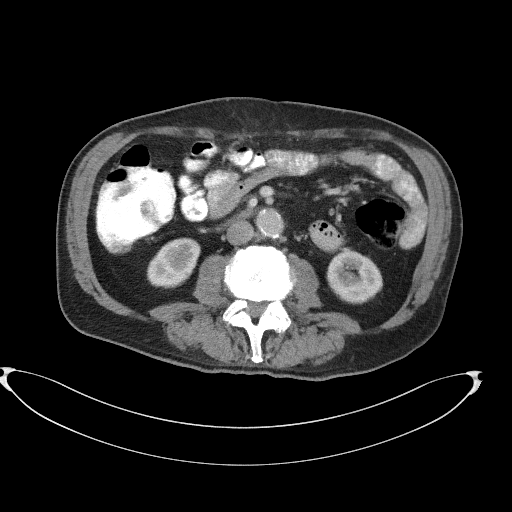
[im 71/116  soft-tissue]
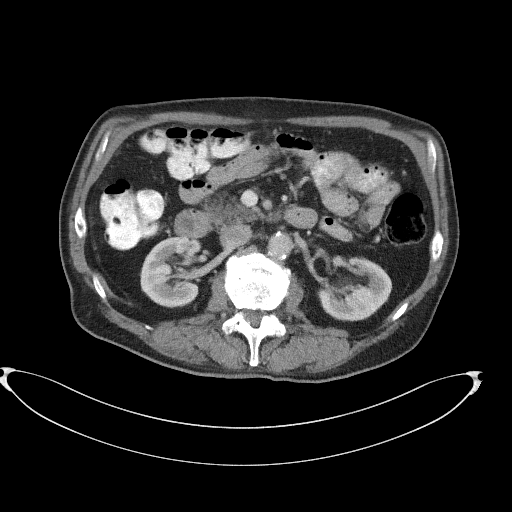
[im 84/116  soft-tissue]
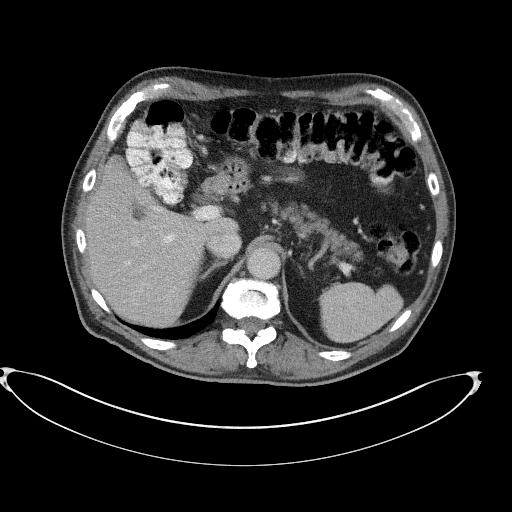
[im 84/116  bone]
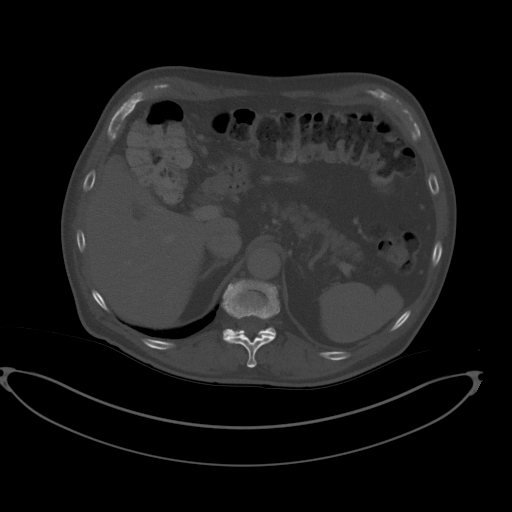
[im 90/116  soft-tissue]
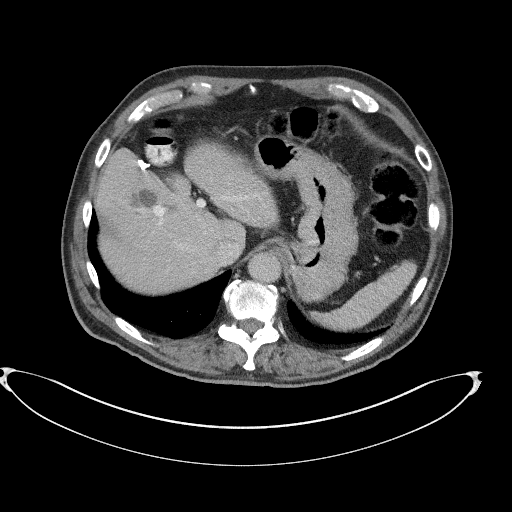
[im 96/116  soft-tissue]
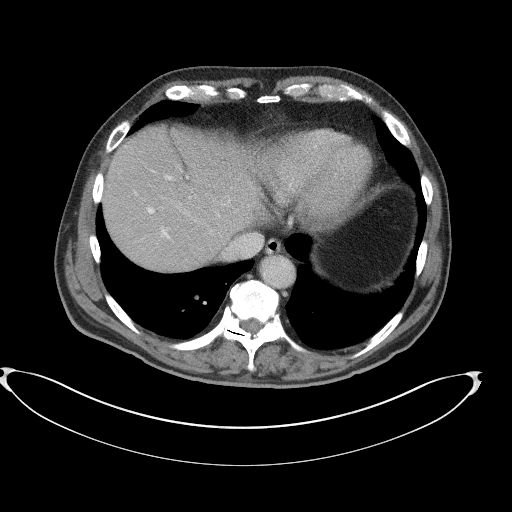
[im 109/116  soft-tissue]
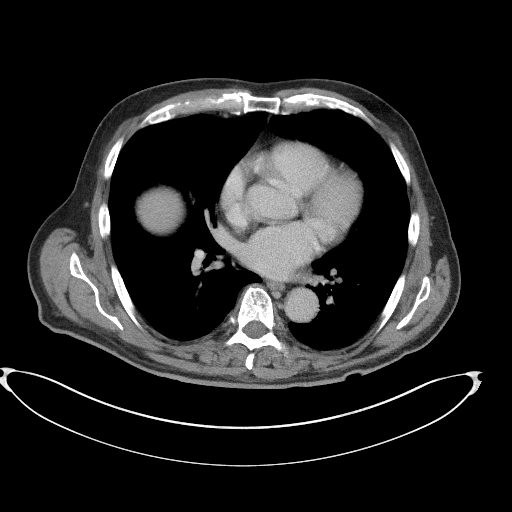

[Series 5: coronal st · coronal · 0.89mm/px · 3 of 116 slices shown]
[im 39/116  soft-tissue]
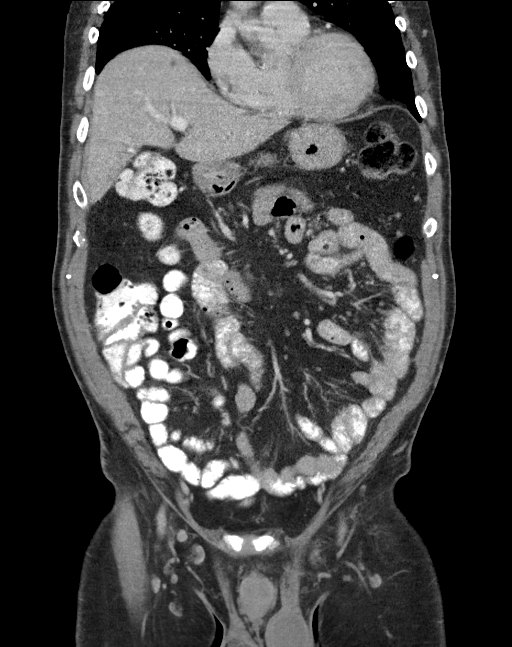
[im 52/116  soft-tissue]
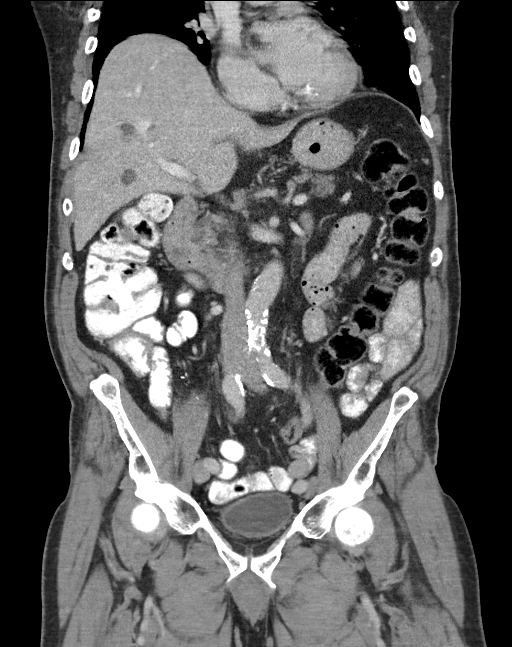
[im 64/116  soft-tissue]
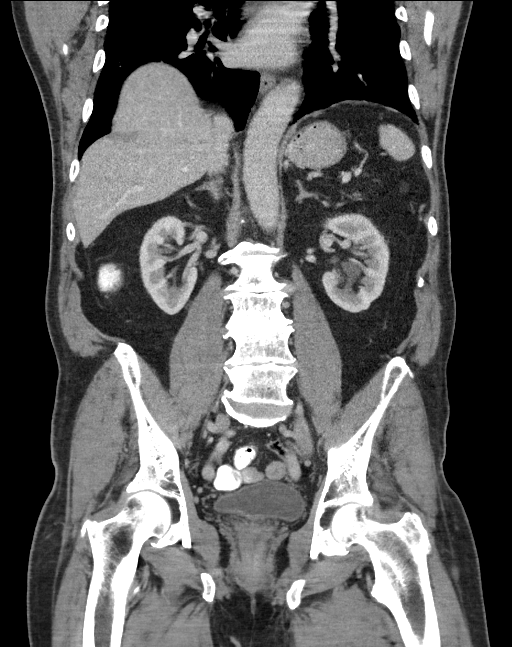

[15 of 46 positions shown; findings below may reference images not displayed]

RADIATION DOSE REDUCTION: This exam was performed according to the
departmental dose-optimization program which includes automated
exposure control, adjustment of the mA and/or kV according to
patient size and/or use of iterative reconstruction technique.

CONTRAST:  100mL OMNIPAQUE IOHEXOL 300 MG/ML  SOLN
FINDINGS: Lower chest: No acute abnormality.

Hepatobiliary: Hypodense hepatic cysts measuring up to 2.8 cm.
Gallbladder surgically absent. No biliary ductal dilation.

Pancreas: Mild fatty atrophy of the head of the pancreas. No
pancreatic ductal dilation or evidence of acute inflammation.

Spleen: Normal size spleen without focal splenic lesion.

Adrenals/Urinary Tract: Bilateral adrenal glands are within normal
limits. No hydronephrosis. Bilateral renal sinus cysts and hypodense
renal lesions too small to accurately characterize but statistically
likely to reflect cysts. No solid enhancing renal mass. Urinary
bladder is unremarkable for degree of distension.

Stomach/Bowel: Radiopaque enteric contrast material traverses the
hepatic flexure. Stomach is minimally distended limiting evaluation.
Periampullary duodenal diverticulum. No pathologic dilation of small
or large bowel. Terminal ileum appears normal. Appendix appears
surgically absent. No evidence of acute bowel inflammation.
Anastomotic sutures at the rectosigmoid junction.

Vascular/Lymphatic: Aortic atherosclerosis without abdominal aortic
aneurysm. No pathologically enlarged abdominal or pelvic lymph
nodes.

Reproductive: Prostate is unremarkable.

Other: There are 4 fat containing ventral hernias including a large
right-sided fat containing hernia with a 15 mm aperture with and a
moderate-sized fat containing left-sided ventral hernia with a
cm aperture with on image 62/2.

Musculoskeletal: Multilevel degenerative changes spine. No acute
osseous abnormality.
IMPRESSION: 1. There are 4 fat containing ventral hernias including a large
right-sided fat containing hernia with a 15 mm aperture width and a
moderate-sized fat containing left-sided ventral hernia with a
cm aperture.
2. Bilateral renal sinus cysts and hypodense renal lesions too small
to accurately characterize but statistically likely to reflect
cysts.
3. Periampullary duodenal diverticulum.
4.  Aortic Atherosclerosis ([id number]).

## 2023-08-19 DIAGNOSIS — Z85820 Personal history of malignant melanoma of skin: Secondary | ICD-10-CM | POA: Diagnosis not present

## 2023-08-19 DIAGNOSIS — L280 Lichen simplex chronicus: Secondary | ICD-10-CM | POA: Diagnosis not present

## 2023-08-19 DIAGNOSIS — L663 Perifolliculitis capitis abscedens: Secondary | ICD-10-CM | POA: Diagnosis not present

## 2023-11-26 DIAGNOSIS — E875 Hyperkalemia: Secondary | ICD-10-CM | POA: Diagnosis not present

## 2023-11-26 DIAGNOSIS — E7849 Other hyperlipidemia: Secondary | ICD-10-CM | POA: Diagnosis not present

## 2023-11-26 DIAGNOSIS — E1165 Type 2 diabetes mellitus with hyperglycemia: Secondary | ICD-10-CM | POA: Diagnosis not present

## 2023-11-26 DIAGNOSIS — E782 Mixed hyperlipidemia: Secondary | ICD-10-CM | POA: Diagnosis not present

## 2023-11-26 DIAGNOSIS — R7301 Impaired fasting glucose: Secondary | ICD-10-CM | POA: Diagnosis not present

## 2023-12-03 DIAGNOSIS — K219 Gastro-esophageal reflux disease without esophagitis: Secondary | ICD-10-CM | POA: Diagnosis not present

## 2023-12-03 DIAGNOSIS — Z6829 Body mass index (BMI) 29.0-29.9, adult: Secondary | ICD-10-CM | POA: Diagnosis not present

## 2023-12-03 DIAGNOSIS — N433 Hydrocele, unspecified: Secondary | ICD-10-CM | POA: Diagnosis not present

## 2023-12-03 DIAGNOSIS — E1165 Type 2 diabetes mellitus with hyperglycemia: Secondary | ICD-10-CM | POA: Diagnosis not present

## 2023-12-03 DIAGNOSIS — E782 Mixed hyperlipidemia: Secondary | ICD-10-CM | POA: Diagnosis not present

## 2023-12-12 DIAGNOSIS — M75102 Unspecified rotator cuff tear or rupture of left shoulder, not specified as traumatic: Secondary | ICD-10-CM | POA: Diagnosis not present

## 2023-12-12 DIAGNOSIS — M12812 Other specific arthropathies, not elsewhere classified, left shoulder: Secondary | ICD-10-CM | POA: Diagnosis not present

## 2023-12-12 DIAGNOSIS — M25512 Pain in left shoulder: Secondary | ICD-10-CM | POA: Diagnosis not present

## 2023-12-22 ENCOUNTER — Other Ambulatory Visit: Payer: Self-pay | Admitting: *Deleted

## 2023-12-22 DIAGNOSIS — Z1211 Encounter for screening for malignant neoplasm of colon: Secondary | ICD-10-CM

## 2023-12-25 ENCOUNTER — Ambulatory Visit (INDEPENDENT_AMBULATORY_CARE_PROVIDER_SITE_OTHER): Admitting: General Surgery

## 2023-12-25 ENCOUNTER — Other Ambulatory Visit: Payer: Self-pay | Admitting: General Surgery

## 2023-12-25 ENCOUNTER — Encounter: Payer: Self-pay | Admitting: General Surgery

## 2023-12-25 ENCOUNTER — Other Ambulatory Visit: Payer: Self-pay

## 2023-12-25 VITALS — BP 145/85 | HR 60 | Temp 98.0°F | Resp 16 | Ht 73.0 in | Wt 217.6 lb

## 2023-12-25 DIAGNOSIS — Z1211 Encounter for screening for malignant neoplasm of colon: Secondary | ICD-10-CM | POA: Diagnosis not present

## 2023-12-25 MED ORDER — SUTAB 1479-225-188 MG PO TABS: 24.0000 | ORAL_TABLET | Freq: Once | ORAL | 0 refills | Status: AC

## 2023-12-25 NOTE — Progress Notes (Signed)
 Harold Payne.; [id number]; 04/13/1951   HPI Patient is a 73 year old white male who was referred to my care by Harold Payne for a follow-up colonoscopy.  Patient has had a history of colon cancer and requires follow-up colonoscopies.  His last one was in 2020 and it was negative.  Patient denies any abdominal pain, abnormal diarrhea or constipation, or blood in stools. Past Medical History:  Diagnosis Date   Arthritis     Past Surgical History:  Procedure Laterality Date   CHOLECYSTECTOMY     COLON SURGERY     COLONOSCOPY     COLONOSCOPY N/A 07/20/2013   Procedure: COLONOSCOPY;  Surgeon: Oneil DELENA Budge, MD;  Location: [facility];  Service: Gastroenterology;  Laterality: N/A;   COLONOSCOPY N/A 02/16/2019   Procedure: COLONOSCOPY;  Surgeon: [name], MD;  Location: [facility];  Service: Gastroenterology;  Laterality: N/A;  7:30am   ELBOW BURSA SURGERY Left    HYDROCELE EXCISION     KNEE ARTHROSCOPY W/ DEBRIDEMENT     VENTRAL HERNIA REPAIR N/A 11/19/2021   Procedure: LAPAROSCOPIC VENTRAL HERNIA REPAIR WITH MESH;  Surgeon: [name], DO;  Location: [facility];  Service: General;  Laterality: N/A;    Family History  Problem Relation Age of Onset   Hypertension Mother    Heart disease Mother    Heart disease Father    Hypertension Father     Current Outpatient Medications on File Prior to Visit  Medication Sig Dispense Refill   aspirin  EC 81 MG tablet Take 1 tablet (81 mg total) by mouth daily. 30 tablet 11   atorvastatin (LIPITOR) 10 MG tablet Take 5 mg by mouth daily.     ibuprofen (ADVIL) 200 MG tablet Take 400-800 mg by mouth every 6 (six) hours as needed for moderate pain.     Multiple Vitamins-Minerals (CENTRUM PO) Take 1 tablet by mouth daily.     olopatadine (PATADAY) 0.1 % ophthalmic solution Place 1 drop into both eyes as needed for allergies.     Polyvinyl Alcohol -Povidone (REFRESH OP) Place 1 drop into both eyes daily as needed (dry eyes).      RABEprazole (ACIPHEX) 20 MG tablet Take 20 mg by mouth daily.     No current facility-administered medications on file prior to visit.    Allergies  Allergen Reactions   Careers adviser and Shortness Of Breath    Social History   Substance and Sexual Activity  Alcohol  Use Yes   Comment: once a week    Social History   Tobacco Use  Smoking Status Never   Passive exposure: Never  Smokeless Tobacco Never    Review of Systems  Constitutional: Negative.   HENT: Negative.    Eyes: Negative.   Respiratory: Negative.    Cardiovascular: Negative.   Gastrointestinal: Negative.   Genitourinary: Negative.   Musculoskeletal: Negative.   Skin: Negative.   Neurological: Negative.   Endo/Heme/Allergies: Negative.   Psychiatric/Behavioral: Negative.      Objective   Vitals:   12/25/23 1054 12/25/23 1057  BP: (!) 165/82 (!) 145/85  Pulse: (!) 56 60  Resp: 16   Temp: 98 F (36.7 C)   SpO2: 92%     Physical Exam Vitals reviewed.  Constitutional:      Appearance: Normal appearance. He is normal weight. He is not ill-appearing.  HENT:     Head: Normocephalic and atraumatic.  Cardiovascular:     Rate and Rhythm: Normal rate and regular rhythm.  Heart sounds: Normal heart sounds. No murmur heard.    No friction rub. No gallop.  Pulmonary:     Effort: Pulmonary effort is normal. No respiratory distress.     Breath sounds: No stridor. No wheezing, rhonchi or rales.  Abdominal:     General: Bowel sounds are normal. There is no distension.     Palpations: Abdomen is soft. There is no mass.     Tenderness: There is no abdominal tenderness. There is no guarding or rebound.     Hernia: No hernia is present.  Skin:    General: Skin is warm and dry.  Neurological:     Mental Status: He is alert and oriented to person, place, and time.   Primary care notes reviewed Previous colonoscopy report reviewed  Assessment  Need for screening colonoscopy Personal history of  colon cancer Plan  Patient is scheduled for a screening colonoscopy on 01/13/2024.  The risks and benefits of the procedure including bleeding and perforation were fully explained to the patient, who gave informed consent.  Sutabs have been prescribed preoperatively for bowel preparation.

## 2023-12-25 NOTE — H&P (Signed)
 Harold Payne.; [id number]; 04/13/1951   HPI Patient is a 73 year old white male who was referred to my care by [name] for a follow-up colonoscopy.  Patient has had a history of colon cancer and requires follow-up colonoscopies.  His last one was in 2020 and it was negative.  Patient denies any abdominal pain, abnormal diarrhea or constipation, or blood in stools. Past Medical History:  Diagnosis Date   Arthritis     Past Surgical History:  Procedure Laterality Date   CHOLECYSTECTOMY     COLON SURGERY     COLONOSCOPY     COLONOSCOPY N/A 07/20/2013   Procedure: COLONOSCOPY;  Surgeon: Oneil DELENA Budge, MD;  Location: [facility];  Service: Gastroenterology;  Laterality: N/A;   COLONOSCOPY N/A 02/16/2019   Procedure: COLONOSCOPY;  Surgeon: [name], MD;  Location: [facility];  Service: Gastroenterology;  Laterality: N/A;  7:30am   ELBOW BURSA SURGERY Left    HYDROCELE EXCISION     KNEE ARTHROSCOPY W/ DEBRIDEMENT     VENTRAL HERNIA REPAIR N/A 11/19/2021   Procedure: LAPAROSCOPIC VENTRAL HERNIA REPAIR WITH MESH;  Surgeon: [name], DO;  Location: [facility];  Service: General;  Laterality: N/A;    Family History  Problem Relation Age of Onset   Hypertension Mother    Heart disease Mother    Heart disease Father    Hypertension Father     Current Outpatient Medications on File Prior to Visit  Medication Sig Dispense Refill   aspirin  EC 81 MG tablet Take 1 tablet (81 mg total) by mouth daily. 30 tablet 11   atorvastatin (LIPITOR) 10 MG tablet Take 5 mg by mouth daily.     ibuprofen (ADVIL) 200 MG tablet Take 400-800 mg by mouth every 6 (six) hours as needed for moderate pain.     Multiple Vitamins-Minerals (CENTRUM PO) Take 1 tablet by mouth daily.     olopatadine (PATADAY) 0.1 % ophthalmic solution Place 1 drop into both eyes as needed for allergies.     Polyvinyl Alcohol -Povidone (REFRESH OP) Place 1 drop into both eyes daily as needed (dry eyes).      RABEprazole (ACIPHEX) 20 MG tablet Take 20 mg by mouth daily.     No current facility-administered medications on file prior to visit.    Allergies  Allergen Reactions   Careers adviser and Shortness Of Breath    Social History   Substance and Sexual Activity  Alcohol  Use Yes   Comment: once a week    Social History   Tobacco Use  Smoking Status Never   Passive exposure: Never  Smokeless Tobacco Never    Review of Systems  Constitutional: Negative.   HENT: Negative.    Eyes: Negative.   Respiratory: Negative.    Cardiovascular: Negative.   Gastrointestinal: Negative.   Genitourinary: Negative.   Musculoskeletal: Negative.   Skin: Negative.   Neurological: Negative.   Endo/Heme/Allergies: Negative.   Psychiatric/Behavioral: Negative.      Objective   Vitals:   12/25/23 1054 12/25/23 1057  BP: (!) 165/82 (!) 145/85  Pulse: (!) 56 60  Resp: 16   Temp: 98 F (36.7 C)   SpO2: 92%     Physical Exam Vitals reviewed.  Constitutional:      Appearance: Normal appearance. He is normal weight. He is not ill-appearing.  HENT:     Head: Normocephalic and atraumatic.  Cardiovascular:     Rate and Rhythm: Normal rate and regular rhythm.  Heart sounds: Normal heart sounds. No murmur heard.    No friction rub. No gallop.  Pulmonary:     Effort: Pulmonary effort is normal. No respiratory distress.     Breath sounds: No stridor. No wheezing, rhonchi or rales.  Abdominal:     General: Bowel sounds are normal. There is no distension.     Palpations: Abdomen is soft. There is no mass.     Tenderness: There is no abdominal tenderness. There is no guarding or rebound.     Hernia: No hernia is present.  Skin:    General: Skin is warm and dry.  Neurological:     Mental Status: He is alert and oriented to person, place, and time.   Primary care notes reviewed Previous colonoscopy report reviewed  Assessment  Need for screening colonoscopy Personal history of  colon cancer Plan  Patient is scheduled for a screening colonoscopy on 01/13/2024.  The risks and benefits of the procedure including bleeding and perforation were fully explained to the patient, who gave informed consent.  Sutabs have been prescribed preoperatively for bowel preparation.

## 2023-12-28 ENCOUNTER — Other Ambulatory Visit: Payer: Self-pay | Admitting: Medical Genetics

## 2024-01-13 ENCOUNTER — Ambulatory Visit (HOSPITAL_BASED_OUTPATIENT_CLINIC_OR_DEPARTMENT_OTHER): Admitting: Anesthesiology

## 2024-01-13 ENCOUNTER — Encounter (HOSPITAL_COMMUNITY): Payer: Self-pay | Admitting: General Surgery

## 2024-01-13 ENCOUNTER — Ambulatory Visit (HOSPITAL_COMMUNITY): Admitting: Anesthesiology

## 2024-01-13 ENCOUNTER — Ambulatory Visit (HOSPITAL_COMMUNITY)
Admission: RE | Admit: 2024-01-13 | Discharge: 2024-01-13 | Disposition: A | Discharge status: Home / Self Care | Attending: General Surgery | Admitting: General Surgery

## 2024-01-13 ENCOUNTER — Encounter (HOSPITAL_COMMUNITY)
Admission: RE | Disposition: A | Discharge status: Home / Self Care | Payer: Self-pay | Source: Home / Self Care | Attending: General Surgery

## 2024-01-13 ENCOUNTER — Other Ambulatory Visit: Payer: Self-pay

## 2024-01-13 DIAGNOSIS — Z79899 Other long term (current) drug therapy: Secondary | ICD-10-CM | POA: Insufficient documentation

## 2024-01-13 DIAGNOSIS — K219 Gastro-esophageal reflux disease without esophagitis: Secondary | ICD-10-CM | POA: Insufficient documentation

## 2024-01-13 DIAGNOSIS — Z1211 Encounter for screening for malignant neoplasm of colon: Secondary | ICD-10-CM | POA: Insufficient documentation

## 2024-01-13 DIAGNOSIS — Z85038 Personal history of other malignant neoplasm of large intestine: Secondary | ICD-10-CM | POA: Insufficient documentation

## 2024-01-13 HISTORY — PX: COLONOSCOPY: SHX5424

## 2024-01-13 SURGERY — COLONOSCOPY
Anesthesia: General

## 2024-01-13 MED ORDER — PROPOFOL 500 MG/50ML IV EMUL
INTRAVENOUS | Status: DC | PRN
  Administered 2024-01-13: 90 mg via INTRAVENOUS
  Administered 2024-01-13: 150 ug/kg/min via INTRAVENOUS

## 2024-01-13 MED ORDER — LACTATED RINGERS IV SOLN: INTRAVENOUS | Status: DC | PRN

## 2024-01-13 MED ORDER — LACTATED RINGERS IV SOLN
INTRAVENOUS | Status: DC
Start: 2024-01-13 — End: 2024-01-13

## 2024-01-13 NOTE — Op Note (Signed)
[facility] Patient Name: Harold Payne Procedure Date: 01/13/2024 7:02 AM MRN: [id number] Date of Birth: 09/24/1950 Attending MD: [name] , MD, [id number] CSN: [id number] Age: 73 Admit Type: Outpatient Procedure:                Colonoscopy Indications:              Screening for colorectal malignant neoplasm (last                            colonoscopy was more than 10 years ago). remote h/o                            colon cancer Providers:                [name], MD, Charlena Edison, RN, Bascom Blush Referring MD:              Medicines:                Propofol  per Anesthesia Complications:            No immediate complications. Estimated Blood Loss:     Estimated blood loss: none. Procedure:                Pre-Anesthesia Assessment:                           - Prior to the procedure, a History and Physical                            was performed, and patient medications and                            allergies were reviewed. The patient is competent.                            The risks and benefits of the procedure and the                            sedation options and risks were discussed with the                            patient. All questions were answered and informed                            consent was obtained. Patient identification and                            proposed procedure were verified by the physician,                            the nurse, the anesthetist and the technician in                            the endoscopy suite. Mental Status Examination:  alert and oriented. Airway Examination: normal                            oropharyngeal airway and neck mobility. Respiratory                            Examination: clear to auscultation. CV Examination:                            RRR, no murmurs, no S3 or S4. Prophylactic                            Antibiotics: The patient does not require                             prophylactic antibiotics. Prior Anticoagulants: The                            patient has taken no anticoagulant or antiplatelet                            agents except for aspirin . ASA Grade Assessment: II                            - A patient with mild systemic disease. After                            reviewing the risks and benefits, the patient was                            deemed in satisfactory condition to undergo the                            procedure. The anesthesia plan was to use deep                            sedation / analgesia. Immediately prior to                            administration of medications, the patient was                            re-assessed for adequacy to receive sedatives. The                            heart rate, respiratory rate, oxygen saturations,                            blood pressure, adequacy of pulmonary ventilation,                            and response to care were monitored throughout the  procedure. The physical status of the patient was                            re-assessed after the procedure.                           After obtaining informed consent, the colonoscope                            was passed under direct vision. Throughout the                            procedure, the patient's blood pressure, pulse, and                            oxygen saturations were monitored continuously. The                            [id number] ([id number]) Colon was introduced through                            the anus and advanced to the the cecum, identified                            by the appendiceal orifice, ileocecal valve and                            palpation. The ileocecal valve was photographed.                            The entire colon was well visualized. The                            colonoscopy was performed without difficulty. The                            quality of the bowel preparation was  adequate. The                            total duration of the procedure was 7 minutes. Scope In: 7:29:51 AM Scope Out: 7:36:27 AM Scope Withdrawal Time: 0 hours 3 minutes 30 seconds  Total Procedure Duration: 0 hours 6 minutes 36 seconds  Findings:      The perianal and digital rectal examinations were normal.      The entire examined colon appeared normal on direct and retroflexion       views. Impression:               - The entire examined colon is normal on direct and                            retroflexion views.                           - No specimens collected. Moderate Sedation:  Moderate (conscious) sedation was administered by the nurse and       supervised by the endoscopist. The patient's oxygen saturation, heart       rate, blood pressure and response to care were monitored. Recommendation:           - Written discharge instructions were provided to                            the patient.                           - The signs and symptoms of potential delayed                            complications were discussed with the patient.                           - Patient has a contact number available for                            emergencies.                           - Return to normal activities tomorrow.                           - Resume previous diet.                           - Continue present medications.                           - Repeat colonoscopy in 10 years for screening                            purposes. Procedure Code(s):        --- Professional ---                           [phone], Colonoscopy, flexible; diagnostic, including                            collection of specimen(s) by brushing or washing,                            when performed (separate procedure) Diagnosis Code(s):        --- Professional ---                           Z12.11, Encounter for screening for malignant                            neoplasm of colon CPT copyright 2022  American Medical Association. All rights reserved. The codes documented in this report are preliminary and upon coder review may  be revised to meet current compliance requirements. [name], MD [name], MD 01/13/2024 7:43:11 AM This report has been signed electronically. Number of Addenda:  0 

## 2024-01-13 NOTE — Interval H&P Note (Signed)
 History and Physical Interval Note:  01/13/2024 7:26 AM  Harold Payne Claudene Mickey.  has presented today for surgery, with the diagnosis of Screening.  The various methods of treatment have been discussed with the patient and family. After consideration of risks, benefits and other options for treatment, the patient has consented to  Procedure(s) with comments: COLONOSCOPY (N/A) - with propofol  as a surgical intervention.  The patient's history has been reviewed, patient examined, no change in status, stable for surgery.  I have reviewed the patient's chart and labs.  Questions were answered to the patient's satisfaction.     [name]

## 2024-01-13 NOTE — Transfer of Care (Signed)
 Immediate Anesthesia Transfer of Care Note  Patient: Harold Payne.  Procedure(s) Performed: COLONOSCOPY  Patient Location: Endoscopy Unit  Anesthesia Type:General  Level of Consciousness: drowsy  Airway & Oxygen Therapy: Patient Spontanous Breathing  Post-op Assessment: Report given to RN and Post -op Vital signs reviewed and stable  Post vital signs: Reviewed and stable  Last Vitals:  Vitals Value Taken Time  BP 101/56 01/13/24 07:38  Temp 36.4 C 01/13/24 07:38  Pulse 61 01/13/24 07:38  Resp 20 01/13/24 07:38  SpO2 92 % 01/13/24 07:38    Last Pain:  Vitals:   01/13/24 0738  TempSrc: Oral  PainSc: 0-No pain      Patients Stated Pain Goal: 6 (01/13/24 9341)  Complications: No notable events documented.

## 2024-01-13 NOTE — Anesthesia Preprocedure Evaluation (Signed)
 Anesthesia Evaluation  Patient identified by MRN, date of birth, ID band Patient awake    Reviewed: Allergy & Precautions, NPO status , Patient's Chart, lab work & pertinent test results  Airway Mallampati: II  TM Distance: >3 FB Neck ROM: Full    Dental  (+) Dental Advisory Given, Caps   Pulmonary neg pulmonary ROS   Pulmonary exam normal breath sounds clear to auscultation       Cardiovascular negative cardio ROS Normal cardiovascular exam Rhythm:Regular Rate:Normal     Neuro/Psych negative neurological ROS  negative psych ROS   GI/Hepatic Neg liver ROS,GERD  Medicated and Controlled,,  Endo/Other  negative endocrine ROS    Renal/GU negative Renal ROS  negative genitourinary   Musculoskeletal  (+) Arthritis , Osteoarthritis,    Abdominal   Peds negative pediatric ROS (+)  Hematology negative hematology ROS (+)   Anesthesia Other Findings Colon cancer history  Reproductive/Obstetrics negative OB ROS                              Anesthesia Physical Anesthesia Plan  ASA: 2  Anesthesia Plan: General   Post-op Pain Management: Minimal or no pain anticipated   Induction: Intravenous  PONV Risk Score and Plan: Propofol  infusion  Airway Management Planned: Nasal Cannula and Natural Airway  Additional Equipment: None  Intra-op Plan:   Post-operative Plan:   Informed Consent: I have reviewed the patients History and Physical, chart, labs and discussed the procedure including the risks, benefits and alternatives for the proposed anesthesia with the patient or authorized representative who has indicated his/her understanding and acceptance.     Dental advisory given  Plan Discussed with: CRNA  Anesthesia Plan Comments:          Anesthesia Quick Evaluation

## 2024-01-13 NOTE — Anesthesia Postprocedure Evaluation (Signed)
 Anesthesia Post Note  Patient: Harold Payne.  Procedure(s) Performed: COLONOSCOPY  Patient location during evaluation: Phase II Anesthesia Type: General Level of consciousness: awake and alert Pain management: pain level controlled Vital Signs Assessment: post-procedure vital signs reviewed and stable Respiratory status: spontaneous breathing, nonlabored ventilation and respiratory function stable Cardiovascular status: stable Anesthetic complications: no   There were no known notable events for this encounter.   Last Vitals:  Vitals:   01/13/24 0738 01/13/24 0744  BP: (!) 101/56 102/63  Pulse: 61 (!) 59  Resp: 20 20  Temp: 36.4 C   SpO2: 92% 92%    Last Pain:  Vitals:   01/13/24 0738  TempSrc: Oral  PainSc: 0-No pain                 Angelic Schnelle L Lavana Huckeba

## 2024-01-14 ENCOUNTER — Encounter (HOSPITAL_COMMUNITY): Payer: Self-pay | Admitting: General Surgery

## 2024-01-19 DIAGNOSIS — H43811 Vitreous degeneration, right eye: Secondary | ICD-10-CM | POA: Diagnosis not present

## 2024-02-06 DIAGNOSIS — Z23 Encounter for immunization: Secondary | ICD-10-CM | POA: Diagnosis not present

## 2024-02-09 ENCOUNTER — Telehealth: Payer: Self-pay | Admitting: *Deleted

## 2024-02-09 NOTE — Telephone Encounter (Signed)
 Copied from CRM [id number]. Topic: Clinical - Prescription Issue >> Feb 06, 2024 11:46 AM Lavanda D wrote: Reason for CRM: Melissa with [facility] is calling regarding the CPAP referral for Mr. Harold Payne. They have requested an rx that is signed and has the CPAP that machine on it, they do have the rx but they need it amended as it is missing the signature + machine type, they are requesting it to be faxed to [phone].

## 2024-02-16 DIAGNOSIS — L570 Actinic keratosis: Secondary | ICD-10-CM | POA: Diagnosis not present

## 2024-02-16 DIAGNOSIS — L280 Lichen simplex chronicus: Secondary | ICD-10-CM | POA: Diagnosis not present

## 2024-02-16 DIAGNOSIS — Z85820 Personal history of malignant melanoma of skin: Secondary | ICD-10-CM | POA: Diagnosis not present

## 2024-02-16 DIAGNOSIS — L573 Poikiloderma of Civatte: Secondary | ICD-10-CM | POA: Diagnosis not present

## 2024-03-22 ENCOUNTER — Other Ambulatory Visit: Payer: Self-pay | Admitting: Medical Genetics

## 2024-03-22 DIAGNOSIS — Z006 Encounter for examination for normal comparison and control in clinical research program: Secondary | ICD-10-CM

## 2024-04-17 LAB — GENECONNECT MOLECULAR SCREEN: Genetic Analysis Overall Interpretation: NEGATIVE
